# Patient Record
Sex: Female | Born: 1980 | Race: White | Hispanic: No | Marital: Married | Smoking: Never smoker
Health system: Southern US, Community
[De-identification: ages and names within clinical notes are randomized; demographics above are authoritative.]

## PROBLEM LIST (undated history)

## (undated) DIAGNOSIS — D649 Anemia, unspecified: Secondary | ICD-10-CM

## (undated) HISTORY — PX: OTHER SURGICAL HISTORY: SHX169

## (undated) HISTORY — DX: Anemia, unspecified: D64.9

---

## 2013-11-25 DIAGNOSIS — R55 Syncope and collapse: Secondary | ICD-10-CM | POA: Insufficient documentation

## 2013-11-25 HISTORY — DX: Syncope and collapse: R55

## 2015-06-17 ENCOUNTER — Ambulatory Visit: Payer: Self-pay | Admitting: Family Medicine

## 2015-07-28 ENCOUNTER — Ambulatory Visit (INDEPENDENT_AMBULATORY_CARE_PROVIDER_SITE_OTHER): Payer: BLUE CROSS/BLUE SHIELD | Admitting: Family Medicine

## 2015-07-28 ENCOUNTER — Encounter: Payer: Self-pay | Admitting: Family Medicine

## 2015-07-28 VITALS — BP 123/81 | HR 70 | Temp 98.7°F | Ht 62.5 in | Wt 126.6 lb

## 2015-07-28 DIAGNOSIS — Z7189 Other specified counseling: Secondary | ICD-10-CM

## 2015-07-28 DIAGNOSIS — Z3169 Encounter for other general counseling and advice on procreation: Secondary | ICD-10-CM

## 2015-07-28 DIAGNOSIS — D225 Melanocytic nevi of trunk: Secondary | ICD-10-CM

## 2015-07-28 DIAGNOSIS — D229 Melanocytic nevi, unspecified: Secondary | ICD-10-CM

## 2015-07-28 DIAGNOSIS — Z7689 Persons encountering health services in other specified circumstances: Secondary | ICD-10-CM

## 2015-07-28 NOTE — Patient Instructions (Signed)
It was very nice to meet you today!  I will arrange for you to see a dermatologist to look at the mole on your behind.  Come and see me for a physical in about 6 months

## 2015-07-28 NOTE — Progress Notes (Signed)
Pre visit review using our clinic tool,if applicable. No additional management support is needed unless otherwise documented below in the visit note.  

## 2015-07-28 NOTE — Progress Notes (Signed)
Christina Lopez at [facility] [address], [place], [place] [address] [phone]  Date:  07/28/2015   Name:  Christina Lopez   DOB:  [birth year]   MRN:  [id number]  PCP:  Lamar Blinks, MD    Chief Complaint: Establish Care   History of Present Illness:  Christina Lopez is a 35 y.o. very pleasant female patient who presents with the following:  She is here today to establish care. 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  She is from [place] and has been here for 2 years.   They are considering having children but "we are not really sure yet.  I may be too old."  However admits that she might want to have kids, has not decided.    She enjoys travel,reading. 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  Never had any surgery. She is generally quite healthy She fainted a year ago- she had a full evaluation that looked ok.  No further fainting. She thinks that her chl may have been a little bit high- she worked on her diet  Never a smoker NKDA For exercise she enjoys using the eliptical machine  There are no active problems to display for this patient.   Past Medical History  Diagnosis Date  . Anemia     No past surgical history on file.  Social History  Substance Use Topics  . Smoking status: Never Smoker   . Smokeless tobacco: Never Used  . Alcohol Use: No    No family history on file.  No Known Allergies  Medication list has been reviewed and updated.  No current outpatient prescriptions on file prior to visit.   No current facility-administered medications on file prior to visit.    Review of Systems:  As per HPI- otherwise negative.   Physical Examination: Filed Vitals:   07/28/15 1516  BP: 123/81  Pulse: 70  Temp: 98.7 F (37.1 C)   Filed Vitals:   07/28/15 1516  Height: 5' 2.5" (1.588 m)  Weight: 126 lb 9.6 oz (57.425 kg)   Body mass index is  22.77 kg/(m^2). Ideal Body Weight: Weight in (lb) to have BMI = 25: 138.6  GEN: WDWN, NAD, Non-toxic, A & O x 3, looks well HEENT: Atraumatic, Normocephalic. Neck supple. No masses, No LAD.  Bilateral TM wnl, oropharynx normal.  PEERL,EOMI.   Ears and Nose: No external deformity. CV: RRR, No M/G/R. No JVD. No thrill. No extra heart sounds. PULM: CTA B, no wheezes, crackles, rhonchi. No retractions. No resp. distress. No accessory muscle use. ABD: S, NT, ND, +BS. No rebound. No HSM. EXTR: No c/c/e NEURO Normal gait.  PSYCH: Normally interactive. Conversant. Not depressed or anxious appearing.  Calm demeanor.  Mole on left upper buttock- dark in color, irregular border, scaly.  Pt points this out as a concern but she also has many other moles on her skin  Assessment and Plan: Nevus of buttock - Plan: Ambulatory referral to Dermatology  Multiple nevi  Pre-conception counseling  Establishing care with new doctor, encounter for  Derm referral to eval unusual appearing mole Advised that she is likely to be able to conceive at this point- however if this is something they want I would encourage them to start trying soon and to seek fertility help if no success in 6 months.  Encouraged them to frankly discuss their desires and to make  a decision about children as a couple  Plan for a CPE in 6 months  Signed Lamar Blinks, MD

## 2017-03-03 DIAGNOSIS — Z23 Encounter for immunization: Secondary | ICD-10-CM | POA: Diagnosis not present

## 2017-09-01 ENCOUNTER — Encounter (HOSPITAL_BASED_OUTPATIENT_CLINIC_OR_DEPARTMENT_OTHER): Payer: Self-pay | Admitting: Emergency Medicine

## 2017-09-01 ENCOUNTER — Other Ambulatory Visit: Payer: Self-pay

## 2017-09-01 ENCOUNTER — Emergency Department (HOSPITAL_BASED_OUTPATIENT_CLINIC_OR_DEPARTMENT_OTHER)
Admission: EM | Admit: 2017-09-01 | Discharge: 2017-09-02 | Disposition: A | Discharge status: Home / Self Care | Payer: BLUE CROSS/BLUE SHIELD | Attending: Emergency Medicine | Admitting: Emergency Medicine

## 2017-09-01 DIAGNOSIS — O218 Other vomiting complicating pregnancy: Secondary | ICD-10-CM | POA: Diagnosis not present

## 2017-09-01 DIAGNOSIS — Z3A01 Less than 8 weeks gestation of pregnancy: Secondary | ICD-10-CM | POA: Diagnosis not present

## 2017-09-01 DIAGNOSIS — Z3491 Encounter for supervision of normal pregnancy, unspecified, first trimester: Secondary | ICD-10-CM | POA: Insufficient documentation

## 2017-09-01 DIAGNOSIS — R112 Nausea with vomiting, unspecified: Secondary | ICD-10-CM

## 2017-09-01 DIAGNOSIS — Z3A00 Weeks of gestation of pregnancy not specified: Secondary | ICD-10-CM | POA: Diagnosis not present

## 2017-09-01 DIAGNOSIS — R8271 Bacteriuria: Secondary | ICD-10-CM | POA: Insufficient documentation

## 2017-09-01 DIAGNOSIS — O9989 Other specified diseases and conditions complicating pregnancy, childbirth and the puerperium: Secondary | ICD-10-CM | POA: Diagnosis not present

## 2017-09-01 DIAGNOSIS — O210 Mild hyperemesis gravidarum: Secondary | ICD-10-CM | POA: Diagnosis not present

## 2017-09-01 LAB — URINALYSIS, ROUTINE W REFLEX MICROSCOPIC
BILIRUBIN URINE: NEGATIVE
Glucose, UA: NEGATIVE mg/dL
HGB URINE DIPSTICK: NEGATIVE
Ketones, ur: 40 mg/dL — AB
Nitrite: NEGATIVE
PROTEIN: NEGATIVE mg/dL
SPECIFIC GRAVITY, URINE: 1.015 (ref 1.005–1.030)
pH: 7.5 (ref 5.0–8.0)

## 2017-09-01 LAB — URINALYSIS, MICROSCOPIC (REFLEX)

## 2017-09-01 LAB — PREGNANCY, URINE: Preg Test, Ur: POSITIVE — AB

## 2017-09-01 NOTE — ED Notes (Signed)
Family at bedside. 

## 2017-09-01 NOTE — ED Provider Notes (Signed)
[facility] EMERGENCY DEPARTMENT Provider Note   CSN: [id number] Arrival date & time: 09/01/17  2017     History   Chief Complaint Chief Complaint  Patient presents with  . Emesis    HPI Christina Lopez is a 37 y.o. female.  HPI  Patient is a 37 year old female, G1, P0 with a history of anemia presenting for emesis.  Patient reports that she took a pregnancy test last week and it was positive.  Patient reports she is been experiencing morning nausea without emesis for the past week.  Patient reports that today, she vomited approximately 6 times last around 2 PM, and then subsequently developed a left-sided headache.  Patient reports it is consistent with prior headaches in her life, but she typically only gets them before menses.  Patient denies any abdominal pain, vaginal bleeding, fevers, chills.  Last menstrual period was April 21 of 2019.  Past Medical History:  Diagnosis Date  . Anemia     There are no active problems to display for this patient.   History reviewed. No pertinent surgical history.   OB History    Gravida  1   Para      Term      Preterm      AB      Living        SAB      TAB      Ectopic      Multiple      Live Births               Home Medications    Prior to Admission medications   Not on File    Family History History reviewed. No pertinent family history.  Social History Social History   Tobacco Use  . Smoking status: Never Smoker  . Smokeless tobacco: Never Used  Substance Use Topics  . Alcohol use: No    Alcohol/week: 0.0 oz  . Drug use: Not on file     Allergies   Patient has no known allergies.   Review of Systems Review of Systems  Constitutional: Negative for chills.  Gastrointestinal: Positive for nausea and vomiting. Negative for abdominal pain.  Genitourinary: Negative for difficulty urinating, flank pain, frequency, urgency, vaginal bleeding, vaginal discharge and vaginal  pain.       +amenorrhea  All other systems reviewed and are negative.    Physical Exam Updated Vital Signs BP (!) 118/91 (BP Location: Left Arm)   Pulse 70   Temp 97.7 F (36.5 C) (Oral)   Resp 18   Wt 55 kg (121 lb 4.1 oz)   SpO2 100%   BMI 21.82 kg/m   Physical Exam  Constitutional: She appears well-developed and well-nourished. No distress.  HENT:  Head: Normocephalic and atraumatic.  Mouth/Throat: Oropharynx is clear and moist.  Eyes: Pupils are equal, round, and reactive to light. Conjunctivae and EOM are normal.  Neck: Normal range of motion. Neck supple.  Cardiovascular: Normal rate, regular rhythm, S1 normal and S2 normal.  No murmur heard. Pulmonary/Chest: Effort normal and breath sounds normal. She has no wheezes. She has no rales.  Abdominal: Soft. She exhibits no distension. There is no tenderness. There is no guarding.  Musculoskeletal: Normal range of motion. She exhibits no edema or deformity.  Neurological: She is alert.  Cranial nerves grossly intact. Patient moves extremities symmetrically and with good coordination.  Skin: Skin is warm and dry. No erythema.  Dry skin lesion noted to right antecubital  fossa, c/w prior psoriasis per pt.  Psychiatric: She has a normal mood and affect. Her behavior is normal. Judgment and thought content normal.  Nursing note and vitals reviewed.    ED Treatments / Results  Labs (all labs ordered are listed, but only abnormal results are displayed) Labs Reviewed  URINALYSIS, ROUTINE W REFLEX MICROSCOPIC - Abnormal; Notable for the following components:      Result Value   APPearance CLOUDY (*)    Ketones, ur 40 (*)    Leukocytes, UA SMALL (*)    All other components within normal limits  URINALYSIS, MICROSCOPIC (REFLEX) - Abnormal; Notable for the following components:   Bacteria, UA MANY (*)    All other components within normal limits  PREGNANCY, URINE - Abnormal; Notable for the following components:   Preg  Test, Ur POSITIVE (*)    All other components within normal limits    EKG None  Radiology No results found.  Procedures Procedures (including critical care time)  Medications Ordered in ED Medications - No data to display   Initial Impression / Assessment and Plan / ED Course  I have reviewed the triage vital signs and the nursing notes.  Pertinent labs & imaging results that were available during my care of the patient were reviewed by me and considered in my medical decision making (see chart for details).     Patient is well-appearing and in no acute distress.  Patient has a completely benign abdomen.  Patient denying any nausea, vomiting, or abdominal pain at this time.  Patient reports her headache has spontaneously resolved from earlier today.    Confirmed pregnancy with urine pregnancy today.  Patient also has asymptomatic bacteriuria, will treat with Keflex.  No signs of pyelonephritis.  Patient successfully perform p.o. challenge in emergency department.  Discussed with patient safe analgesia in pregnancy with Tylenol.  Prescribed doxylamine and pyridoxine for nausea management.  Patient is following up with her first OB/GYN appointment on Tuesday, 6- 4-20 19.  Patient can return precautions for any intractable nausea or vomiting, vaginal bleeding, or severe abdominal pain.  Patient is in understanding and agrees with plan of care.  This is a supervised visit with Dr. Malvin Johns. Evaluation, management, and discharge planning discussed with this attending physician.  Final Clinical Impressions(s) / ED Diagnoses   Final diagnoses:  First trimester pregnancy  Bacteria in urine  Non-intractable vomiting with nausea, unspecified vomiting type      Tamala Julian 09/02/17 1157    Malvin Johns, MD 09/02/17 1501

## 2017-09-01 NOTE — ED Triage Notes (Signed)
Patient states that she is pregnant  - today she started to have vomiting that she was unsual for her, and a headache. She is concerned because she is pregnant what to take for the headache

## 2017-09-02 MED ORDER — CEPHALEXIN 500 MG PO CAPS: 500.0000 mg | ORAL_CAPSULE | Freq: Two times a day (BID) | ORAL | 0 refills | Status: AC

## 2017-09-02 MED ORDER — DOXYLAMINE SUCCINATE (SLEEP) 25 MG PO TABS: 12.5000 mg | ORAL_TABLET | Freq: Three times a day (TID) | ORAL | 0 refills | Status: DC

## 2017-09-02 MED ORDER — PYRIDOXINE HCL 25 MG PO TABS: 12.5000 mg | ORAL_TABLET | Freq: Three times a day (TID) | ORAL | 0 refills | Status: DC

## 2017-09-02 MED ORDER — CEPHALEXIN 500 MG PO CAPS: 500.0000 mg | ORAL_CAPSULE | Freq: Two times a day (BID) | ORAL | 0 refills | Status: DC

## 2017-09-02 MED ORDER — CEPHALEXIN 250 MG PO CAPS
500.0000 mg | ORAL_CAPSULE | Freq: Once | ORAL | Status: AC
  Administered 2017-09-02: 500 mg via ORAL
  Filled 2017-09-02: qty 2

## 2017-09-02 NOTE — Discharge Instructions (Signed)
Please see the information and instructions below regarding your visit.  Your diagnoses today include:  1. First trimester pregnancy   2. Bacteria in urine   3. Non-intractable vomiting with nausea, unspecified vomiting type     Tests performed today include: See side panel of your discharge paperwork for testing performed today. Vital signs are listed at the bottom of these instructions.   Medications prescribed:    Take any prescribed medications only as prescribed, and any over the counter medications only as directed on the packaging.  You may use doxylamine, available as an over-the-counter sleeping pills (eg, Unisom Sleep Tabs): One-half of the 25 mg over-the-counter tablet twice a day for vomiting. In addition, pyridoxine 25 mg (Vitamin B6), also available over-the-counter, is taken three or four times per day for vomiting.   Doxylamine is similar to Benadryl and can make you sleepy. Please do not drive while taking this medication.  Your urine shows you have some bacteria in it.  In pregnant women, we treat this is a urinary tract infection.  He will take the antibiotic Keflex.  For pain, you may take Tylenol, 650 mg every 6 hours as needed for pain.  Home care instructions:  Please follow any educational materials contained in this packet.   Please start with bland foods for your nausea.  Ginger ale is good for nausea of early pregnancy.  Follow-up instructions: Please follow-up with your OB/GYN for your previously scheduled appointment.  Return instructions:  Please return to the Emergency Department if you experience worsening symptoms.  Please return to the emergency department for any increasing belly pain, nausea or vomiting the prevents you from keeping anything down, or vaginal bleeding. Please return if you have any other emergent concerns.  Additional Information:   Your vital signs today were: BP (!) 118/91 (BP Location: Left Arm)    Pulse 70    Temp 97.7 F  (36.5 C) (Oral)    Resp 18    Wt 55 kg (121 lb 4.1 oz)    SpO2 100%    BMI 21.82 kg/m  If your blood pressure (BP) was elevated on multiple readings during this visit above 130 for the top number or above 80 for the bottom number, please have this repeated by your primary care provider within one month. --------------  Thank you for allowing [place] to participate in your care today.

## 2017-09-02 NOTE — ED Notes (Signed)
Patient is alert and orientedx4.  Patient was explained discharge instructions and they understood them with no questions.  The patient's husband Kittie Plater, is taking the patient home.

## 2017-09-04 DIAGNOSIS — Z3201 Encounter for pregnancy test, result positive: Secondary | ICD-10-CM | POA: Diagnosis not present

## 2017-09-04 DIAGNOSIS — Z3400 Encounter for supervision of normal first pregnancy, unspecified trimester: Secondary | ICD-10-CM | POA: Diagnosis not present

## 2017-09-04 DIAGNOSIS — Z3401 Encounter for supervision of normal first pregnancy, first trimester: Secondary | ICD-10-CM | POA: Diagnosis not present

## 2017-09-04 LAB — OB RESULTS CONSOLE GC/CHLAMYDIA
Chlamydia: NEGATIVE
Gonorrhea: NEGATIVE

## 2017-09-04 LAB — OB RESULTS CONSOLE RPR: RPR: NONREACTIVE

## 2017-09-04 LAB — OB RESULTS CONSOLE ABO/RH: RH TYPE: NEGATIVE

## 2017-09-04 LAB — OB RESULTS CONSOLE ANTIBODY SCREEN: ANTIBODY SCREEN: NEGATIVE

## 2017-09-04 LAB — OB RESULTS CONSOLE RUBELLA ANTIBODY, IGM: Rubella: IMMUNE

## 2017-09-04 LAB — OB RESULTS CONSOLE HIV ANTIBODY (ROUTINE TESTING): HIV: NONREACTIVE

## 2017-09-04 LAB — OB RESULTS CONSOLE HEPATITIS B SURFACE ANTIGEN: HEP B S AG: NEGATIVE

## 2017-09-09 NOTE — Progress Notes (Signed)
[place] at [facility] [address], [place], [place] [address] [phone]  Date:  09/10/2017   Name:  Christina Lopez   DOB:  [birth year]   MRN:  [id number]  PCP:  Darreld Mclean, MD    Chief Complaint: Routine follow up (recent pregnancy, dental implants)   History of Present Illness:  Christina Lopez is a 37 y.o. very pleasant female patient who presents with the following:  Last seen by myself about 2 years ago.   She was seen in the ER just recently and dx with pregnancy - she is due at the end of January 2020. She has a GYN doctor with [facility] and has been to her first visit already   She has some nausea from her pregnancy but she is otherwise doing ok She does have some vomiting but not terrible- last threw up a week ago  She had a dental implant placed last year and is doing well in this regard  She really does not have any other concerns today but wanted to be seen as it has been a while She has a copy of her green card health paperwork which we will scan in for her No need to do labs today as her GYN is monitoring her glucose, etc She denies any abd cramping or vaginal bleeding  [name]  There are no active problems to display for this patient.   Past Medical History:  Diagnosis Date  . Anemia     History reviewed. No pertinent surgical history.  Social History   Tobacco Use  . Smoking status: Never Smoker  . Smokeless tobacco: Never Used  Substance Use Topics  . Alcohol use: No    Alcohol/week: 0.0 oz  . Drug use: Not on file    History reviewed. No pertinent family history.  No Known Allergies  Medication list has been reviewed and updated.  Current Outpatient Medications on File Prior to Visit  Medication Sig Dispense Refill  . doxylamine, Sleep, (UNISOM) 25 MG tablet Take 0.5 tablets (12.5 mg total) by mouth 3 (three)  times daily. 15 tablet 0  . pyridOXINE (VITAMIN B-6) 25 MG tablet Take 0.5 tablets (12.5 mg total) by mouth 3 (three) times daily. 15 tablet 0   No current facility-administered medications on file prior to visit.     Review of Systems:  As per HPI- otherwise negative. No fever or chills No CP or SOB She is generally in good health    Physical Examination: Vitals:   09/10/17 1523  BP: 106/70  Pulse: 78  Resp: 16  SpO2: 100%   Vitals:   09/10/17 1523  Weight: 137 lb (62.1 kg)  Height: 5' 2.5" (1.588 m)   Body mass index is 24.66 kg/m. Ideal Body Weight: Weight in (lb) to have BMI = 25: 138.6  GEN: WDWN, NAD, Non-toxic, A & O x 3, normal weight, looks well  HEENT: Atraumatic, Normocephalic. Neck supple. No masses, No LAD.  Bilateral TM wnl, oropharynx normal.  PEERL,EOMI.   Ears and Nose: No external deformity. CV: RRR, No M/G/R. No JVD. No thrill. No extra heart sounds. PULM: CTA B, no wheezes, crackles, rhonchi. No retractions. No resp. distress. No accessory muscle use. ABD: S, NT, ND, +BS. No rebound. No HSM. EXTR: No c/c/e NEURO Normal gait.  PSYCH: Normally interactive. Conversant. Not depressed or anxious appearing.  Calm  demeanor.    Assessment and Plan: Immunizations reviewed and up to date  Pregnancy, first, first trimester  Nausea and vomiting during pregnancy  Following up with me today She is pregnancy with their first child and is doing well so far- shared my congratulations and hopes that all will go smoothly for her.  Counseled her to follow-up with her GYN for any severe nausea/ vomiting as there are rx medicatoins that might be helpful She did have a Tdap last year   Signed Lamar Blinks, MD

## 2017-09-10 ENCOUNTER — Ambulatory Visit: Payer: BLUE CROSS/BLUE SHIELD | Admitting: Family Medicine

## 2017-09-10 ENCOUNTER — Encounter: Payer: Self-pay | Admitting: Family Medicine

## 2017-09-10 VITALS — BP 106/70 | HR 78 | Resp 16 | Ht 62.5 in | Wt 137.0 lb

## 2017-09-10 DIAGNOSIS — Z7689 Persons encountering health services in other specified circumstances: Secondary | ICD-10-CM

## 2017-09-10 DIAGNOSIS — Z3401 Encounter for supervision of normal first pregnancy, first trimester: Secondary | ICD-10-CM

## 2017-09-10 DIAGNOSIS — O219 Vomiting of pregnancy, unspecified: Secondary | ICD-10-CM | POA: Diagnosis not present

## 2017-09-10 NOTE — Patient Instructions (Signed)
Great to see you today! No labs today as your GYN is doing plenty of bloodwork. Best of luck with the rest of your pregnancy and take care!

## 2017-10-03 ENCOUNTER — Other Ambulatory Visit (HOSPITAL_COMMUNITY)
Admission: RE | Admit: 2017-10-03 | Discharge: 2017-10-03 | Disposition: A | Discharge status: Home / Self Care | Payer: BLUE CROSS/BLUE SHIELD | Source: Ambulatory Visit | Attending: Obstetrics and Gynecology | Admitting: Obstetrics and Gynecology

## 2017-10-03 ENCOUNTER — Other Ambulatory Visit: Payer: Self-pay | Admitting: Obstetrics and Gynecology

## 2017-10-03 DIAGNOSIS — O26899 Other specified pregnancy related conditions, unspecified trimester: Secondary | ICD-10-CM | POA: Diagnosis not present

## 2017-10-03 DIAGNOSIS — Z01411 Encounter for gynecological examination (general) (routine) with abnormal findings: Secondary | ICD-10-CM | POA: Insufficient documentation

## 2017-10-03 DIAGNOSIS — N898 Other specified noninflammatory disorders of vagina: Secondary | ICD-10-CM | POA: Diagnosis not present

## 2017-10-03 DIAGNOSIS — O09511 Supervision of elderly primigravida, first trimester: Secondary | ICD-10-CM | POA: Diagnosis not present

## 2017-10-09 LAB — CYTOLOGY - PAP
Chlamydia: NEGATIVE
DIAGNOSIS: NEGATIVE
HPV (WINDOPATH): NOT DETECTED
Neisseria Gonorrhea: NEGATIVE

## 2017-10-10 DIAGNOSIS — O09511 Supervision of elderly primigravida, first trimester: Secondary | ICD-10-CM | POA: Diagnosis not present

## 2017-11-27 DIAGNOSIS — O09512 Supervision of elderly primigravida, second trimester: Secondary | ICD-10-CM | POA: Diagnosis not present

## 2017-11-27 DIAGNOSIS — Z36 Encounter for antenatal screening for chromosomal anomalies: Secondary | ICD-10-CM | POA: Diagnosis not present

## 2017-11-27 DIAGNOSIS — Z3402 Encounter for supervision of normal first pregnancy, second trimester: Secondary | ICD-10-CM | POA: Diagnosis not present

## 2017-12-25 DIAGNOSIS — Z23 Encounter for immunization: Secondary | ICD-10-CM | POA: Diagnosis not present

## 2018-01-22 DIAGNOSIS — O09512 Supervision of elderly primigravida, second trimester: Secondary | ICD-10-CM | POA: Diagnosis not present

## 2018-01-31 DIAGNOSIS — Z3182 Encounter for Rh incompatibility status: Secondary | ICD-10-CM | POA: Diagnosis not present

## 2018-02-12 DIAGNOSIS — Z23 Encounter for immunization: Secondary | ICD-10-CM | POA: Diagnosis not present

## 2018-04-02 DIAGNOSIS — Z3483 Encounter for supervision of other normal pregnancy, third trimester: Secondary | ICD-10-CM | POA: Diagnosis not present

## 2018-04-02 LAB — OB RESULTS CONSOLE GBS: GBS: NEGATIVE

## 2018-04-03 NOTE — L&D Delivery Note (Signed)
Delivery Note At 5:11 PM a viable female was delivered via Vaginal, Spontaneous (Presentation:occiput anterior  ;  ).  APGAR: 9, 9; weight ( pending) .   Placenta status:intact 3 vessel cord  with the following complications: None  Cord collected for cord blood registry   Cord pH: NA  Anesthesia:  Epidural  Episiotomy: None Lacerations: 2nd degree Suture Repair: 3.0 vicryl Est. Blood Loss (mL):  250 mL  Mom to postpartum.  Baby to Couplet care / Skin to Skin.  Christophe Louis 04/24/2018, 5:49 PM

## 2018-04-17 ENCOUNTER — Telehealth (HOSPITAL_COMMUNITY): Payer: Self-pay | Admitting: *Deleted

## 2018-04-17 ENCOUNTER — Encounter (HOSPITAL_COMMUNITY): Payer: Self-pay | Admitting: *Deleted

## 2018-04-17 ENCOUNTER — Other Ambulatory Visit (HOSPITAL_COMMUNITY): Payer: Self-pay | Admitting: Obstetrics and Gynecology

## 2018-04-17 DIAGNOSIS — Z3A39 39 weeks gestation of pregnancy: Secondary | ICD-10-CM

## 2018-04-17 DIAGNOSIS — O09513 Supervision of elderly primigravida, third trimester: Secondary | ICD-10-CM

## 2018-04-17 NOTE — Telephone Encounter (Signed)
Preadmission screen  

## 2018-04-22 ENCOUNTER — Other Ambulatory Visit: Payer: Self-pay | Admitting: Obstetrics and Gynecology

## 2018-04-22 ENCOUNTER — Ambulatory Visit (HOSPITAL_COMMUNITY)
Admission: RE | Admit: 2018-04-22 | Discharge: 2018-04-22 | Disposition: A | Discharge status: Home / Self Care | Payer: BLUE CROSS/BLUE SHIELD | Source: Ambulatory Visit | Attending: Obstetrics and Gynecology | Admitting: Obstetrics and Gynecology

## 2018-04-22 DIAGNOSIS — O09513 Supervision of elderly primigravida, third trimester: Secondary | ICD-10-CM | POA: Diagnosis not present

## 2018-04-22 DIAGNOSIS — Z3A39 39 weeks gestation of pregnancy: Secondary | ICD-10-CM | POA: Insufficient documentation

## 2018-04-22 NOTE — H&P (Deleted)
  The note originally documented on this encounter has been moved the the encounter in which it belongs.  

## 2018-04-22 NOTE — H&P (Signed)
Christina Lopez is a 38 y.o. female  G1P0 at 40 wks and 0 days presenting for induction of labor due to advanced maternal age at term. Pregnancy has been otherwise uncomplicated. Prenatal care provided by Dr. Christophe Louis . OB History    Gravida  1   Para      Term      Preterm      AB      Living        SAB      TAB      Ectopic      Multiple      Live Births             Past Medical History:  Diagnosis Date  . Anemia    No past surgical history on file. Family History: family history includes Breast cancer in her maternal grandmother; Depression in her maternal grandmother; Diabetes in her paternal grandfather; Heart disease in her paternal grandmother; Hypertension in her paternal grandmother. Social History:  reports that she has never smoked. She has never used smokeless tobacco. She reports that she does not drink alcohol. No history on file for drug.     Maternal Diabetes: No Genetic Screening: Normal Maternal Ultrasounds/Referrals: Normal Fetal Ultrasounds or other Referrals:  None Maternal Substance Abuse:  No Significant Maternal Medications:  None Significant Maternal Lab Results:  Lab values include: Group B Strep negative Other Comments:  None  Review of Systems  Constitutional: Negative.   HENT: Negative.   Eyes: Negative.   Respiratory: Negative.   Cardiovascular: Negative.   Gastrointestinal: Negative.   Genitourinary: Negative.   Musculoskeletal: Negative.   Skin: Negative.   Neurological: Negative.   Endo/Heme/Allergies: Negative.   Psychiatric/Behavioral: Negative.    History   Last menstrual period 07/18/2017. Exam Physical Exam  Vitals reviewed. Constitutional: She is oriented to person, place, and time. She appears well-developed and well-nourished.  HENT:  Head: Normocephalic and atraumatic.  Eyes: Pupils are equal, round, and reactive to light. Conjunctivae are normal.  Neck: Normal range of motion. Neck supple.   Cardiovascular: Normal rate and regular rhythm.  Respiratory: Effort normal and breath sounds normal.  GI: Bowel sounds are normal. There is no abdominal tenderness.  Genitourinary:    Vagina normal.   Musculoskeletal: Normal range of motion.        General: No edema.  Neurological: She is alert and oriented to person, place, and time.  Skin: Skin is warm and dry.  Psychiatric: She has a normal mood and affect.    Prenatal labs: ABO, Rh: A/Negative/-- (06/04 0000) Antibody: Negative (06/04 0000) Rubella: Immune (06/04 0000) RPR: Nonreactive (06/04 0000)  HBsAg: Negative (06/04 0000)  HIV: Non-reactive (06/04 0000)  GBS: Negative (12/31 0000)   Assessment/Plan: 40 wks and 0 days with AMA for induction at term.  Cytotec for cervical ripening  Anticipate SVD.   Christophe Louis 04/22/2018, 5:15 PM

## 2018-04-24 ENCOUNTER — Inpatient Hospital Stay (HOSPITAL_COMMUNITY): Payer: BLUE CROSS/BLUE SHIELD | Admitting: Anesthesiology

## 2018-04-24 ENCOUNTER — Encounter (HOSPITAL_COMMUNITY): Payer: Self-pay

## 2018-04-24 ENCOUNTER — Inpatient Hospital Stay (HOSPITAL_COMMUNITY)
Admission: RE | Admit: 2018-04-24 | Discharge: 2018-04-26 | DRG: 807 | Disposition: A | Discharge status: Home / Self Care | Payer: BLUE CROSS/BLUE SHIELD | Attending: Obstetrics and Gynecology | Admitting: Obstetrics and Gynecology

## 2018-04-24 ENCOUNTER — Inpatient Hospital Stay (HOSPITAL_COMMUNITY): Admission: RE | Admit: 2018-04-24 | Payer: BLUE CROSS/BLUE SHIELD | Source: Ambulatory Visit

## 2018-04-24 ENCOUNTER — Inpatient Hospital Stay (HOSPITAL_COMMUNITY)
Admission: AD | Admit: 2018-04-24 | Payer: BLUE CROSS/BLUE SHIELD | Source: Ambulatory Visit | Admitting: Obstetrics and Gynecology

## 2018-04-24 DIAGNOSIS — O26893 Other specified pregnancy related conditions, third trimester: Secondary | ICD-10-CM | POA: Diagnosis not present

## 2018-04-24 DIAGNOSIS — Z9189 Other specified personal risk factors, not elsewhere classified: Secondary | ICD-10-CM | POA: Diagnosis not present

## 2018-04-24 DIAGNOSIS — Z3A40 40 weeks gestation of pregnancy: Secondary | ICD-10-CM | POA: Diagnosis not present

## 2018-04-24 DIAGNOSIS — Z3A39 39 weeks gestation of pregnancy: Secondary | ICD-10-CM | POA: Diagnosis not present

## 2018-04-24 DIAGNOSIS — O09513 Supervision of elderly primigravida, third trimester: Secondary | ICD-10-CM

## 2018-04-24 DIAGNOSIS — Z23 Encounter for immunization: Secondary | ICD-10-CM | POA: Diagnosis not present

## 2018-04-24 HISTORY — DX: Supervision of elderly primigravida, third trimester: O09.513

## 2018-04-24 LAB — CBC
HCT: 35.4 % — ABNORMAL LOW (ref 36.0–46.0)
Hemoglobin: 11.9 g/dL — ABNORMAL LOW (ref 12.0–15.0)
MCH: 32.2 pg (ref 26.0–34.0)
MCHC: 33.6 g/dL (ref 30.0–36.0)
MCV: 95.7 fL (ref 80.0–100.0)
Platelets: 171 10*3/uL (ref 150–400)
RBC: 3.7 MIL/uL — ABNORMAL LOW (ref 3.87–5.11)
RDW: 14.3 % (ref 11.5–15.5)
WBC: 8.1 10*3/uL (ref 4.0–10.5)

## 2018-04-24 LAB — RPR: RPR Ser Ql: NONREACTIVE

## 2018-04-24 MED ORDER — PHENYLEPHRINE 40 MCG/ML (10ML) SYRINGE FOR IV PUSH (FOR BLOOD PRESSURE SUPPORT)
80.0000 ug | PREFILLED_SYRINGE | INTRAVENOUS | Status: DC | PRN
  Filled 2018-04-24: qty 10

## 2018-04-24 MED ORDER — OXYCODONE-ACETAMINOPHEN 5-325 MG PO TABS: 1.0000 | ORAL_TABLET | ORAL | Status: DC | PRN

## 2018-04-24 MED ORDER — LACTATED RINGERS IV SOLN: 500.0000 mL | INTRAVENOUS | Status: DC | PRN

## 2018-04-24 MED ORDER — SENNOSIDES-DOCUSATE SODIUM 8.6-50 MG PO TABS
2.0000 | ORAL_TABLET | ORAL | Status: DC
  Administered 2018-04-25 – 2018-04-26 (×2): 2 via ORAL
  Filled 2018-04-24 (×2): qty 2

## 2018-04-24 MED ORDER — METHYLERGONOVINE MALEATE 0.2 MG PO TABS: 0.2000 mg | ORAL_TABLET | ORAL | Status: DC | PRN

## 2018-04-24 MED ORDER — ACETAMINOPHEN 325 MG PO TABS: 650.0000 mg | ORAL_TABLET | ORAL | Status: DC | PRN

## 2018-04-24 MED ORDER — ONDANSETRON HCL 4 MG/2ML IJ SOLN: 4.0000 mg | INTRAMUSCULAR | Status: DC | PRN

## 2018-04-24 MED ORDER — OXYTOCIN BOLUS FROM INFUSION: 500.0000 mL | Freq: Once | INTRAVENOUS | Status: DC

## 2018-04-24 MED ORDER — DIPHENHYDRAMINE HCL 25 MG PO CAPS: 25.0000 mg | ORAL_CAPSULE | Freq: Four times a day (QID) | ORAL | Status: DC | PRN

## 2018-04-24 MED ORDER — SIMETHICONE 80 MG PO CHEW: 80.0000 mg | CHEWABLE_TABLET | ORAL | Status: DC | PRN

## 2018-04-24 MED ORDER — ZOLPIDEM TARTRATE 5 MG PO TABS: 5.0000 mg | ORAL_TABLET | Freq: Every evening | ORAL | Status: DC | PRN

## 2018-04-24 MED ORDER — FENTANYL 2.5 MCG/ML BUPIVACAINE 1/10 % EPIDURAL INFUSION (WH - ANES)
INTRAMUSCULAR | Status: AC
  Filled 2018-04-24: qty 100

## 2018-04-24 MED ORDER — COCONUT OIL OIL
1.0000 "application " | TOPICAL_OIL | Status: DC | PRN
  Administered 2018-04-25: 1 via TOPICAL
  Filled 2018-04-24: qty 120

## 2018-04-24 MED ORDER — LIDOCAINE HCL (PF) 1 % IJ SOLN
30.0000 mL | INTRAMUSCULAR | Status: DC | PRN
  Filled 2018-04-24: qty 30

## 2018-04-24 MED ORDER — TERBUTALINE SULFATE 1 MG/ML IJ SOLN
0.2500 mg | Freq: Once | INTRAMUSCULAR | Status: DC | PRN
  Filled 2018-04-24: qty 1

## 2018-04-24 MED ORDER — DIBUCAINE 1 % RE OINT: 1.0000 "application " | TOPICAL_OINTMENT | RECTAL | Status: DC | PRN

## 2018-04-24 MED ORDER — EPHEDRINE 5 MG/ML INJ
10.0000 mg | INTRAVENOUS | Status: DC | PRN
  Filled 2018-04-24: qty 2

## 2018-04-24 MED ORDER — OXYTOCIN 40 UNITS IN NORMAL SALINE INFUSION - SIMPLE MED
1.0000 m[IU]/min | INTRAVENOUS | Status: DC
  Administered 2018-04-24: 2 m[IU]/min via INTRAVENOUS
  Filled 2018-04-24: qty 1000

## 2018-04-24 MED ORDER — MISOPROSTOL 25 MCG QUARTER TABLET
25.0000 ug | ORAL_TABLET | ORAL | Status: DC | PRN
  Administered 2018-04-24: 25 ug via VAGINAL
  Filled 2018-04-24 (×3): qty 1

## 2018-04-24 MED ORDER — PHENYLEPHRINE 40 MCG/ML (10ML) SYRINGE FOR IV PUSH (FOR BLOOD PRESSURE SUPPORT)
PREFILLED_SYRINGE | INTRAVENOUS | Status: AC
  Filled 2018-04-24: qty 10

## 2018-04-24 MED ORDER — FENTANYL CITRATE (PF) 100 MCG/2ML IJ SOLN: 50.0000 ug | INTRAMUSCULAR | Status: DC | PRN

## 2018-04-24 MED ORDER — FENTANYL 2.5 MCG/ML BUPIVACAINE 1/10 % EPIDURAL INFUSION (WH - ANES)
14.0000 mL/h | INTRAMUSCULAR | Status: DC | PRN
  Administered 2018-04-24: 14 mL/h via EPIDURAL

## 2018-04-24 MED ORDER — ONDANSETRON HCL 4 MG PO TABS: 4.0000 mg | ORAL_TABLET | ORAL | Status: DC | PRN

## 2018-04-24 MED ORDER — OXYCODONE-ACETAMINOPHEN 5-325 MG PO TABS: 2.0000 | ORAL_TABLET | ORAL | Status: DC | PRN

## 2018-04-24 MED ORDER — PRENATAL MULTIVITAMIN CH
1.0000 | ORAL_TABLET | Freq: Every day | ORAL | Status: DC
  Administered 2018-04-25 – 2018-04-26 (×2): 1 via ORAL
  Filled 2018-04-24 (×2): qty 1

## 2018-04-24 MED ORDER — LACTATED RINGERS IV SOLN: 500.0000 mL | Freq: Once | INTRAVENOUS | Status: DC

## 2018-04-24 MED ORDER — FERROUS SULFATE 325 (65 FE) MG PO TABS
325.0000 mg | ORAL_TABLET | Freq: Two times a day (BID) | ORAL | Status: DC
  Administered 2018-04-25 – 2018-04-26 (×3): 325 mg via ORAL
  Filled 2018-04-24 (×3): qty 1

## 2018-04-24 MED ORDER — METHYLERGONOVINE MALEATE 0.2 MG/ML IJ SOLN: 0.2000 mg | INTRAMUSCULAR | Status: DC | PRN

## 2018-04-24 MED ORDER — IBUPROFEN 600 MG PO TABS
600.0000 mg | ORAL_TABLET | Freq: Four times a day (QID) | ORAL | Status: DC
  Administered 2018-04-24 – 2018-04-26 (×8): 600 mg via ORAL
  Filled 2018-04-24 (×8): qty 1

## 2018-04-24 MED ORDER — SOD CITRATE-CITRIC ACID 500-334 MG/5ML PO SOLN: 30.0000 mL | ORAL | Status: DC | PRN

## 2018-04-24 MED ORDER — OXYTOCIN 40 UNITS IN NORMAL SALINE INFUSION - SIMPLE MED: 2.5000 [IU]/h | INTRAVENOUS | Status: DC

## 2018-04-24 MED ORDER — LACTATED RINGERS IV SOLN
INTRAVENOUS | Status: DC
  Administered 2018-04-24: 01:00:00 via INTRAVENOUS

## 2018-04-24 MED ORDER — BENZOCAINE-MENTHOL 20-0.5 % EX AERO
1.0000 "application " | INHALATION_SPRAY | CUTANEOUS | Status: DC | PRN
  Administered 2018-04-25: 1 via TOPICAL
  Filled 2018-04-24: qty 56

## 2018-04-24 MED ORDER — ONDANSETRON HCL 4 MG/2ML IJ SOLN: 4.0000 mg | Freq: Four times a day (QID) | INTRAMUSCULAR | Status: DC | PRN

## 2018-04-24 MED ORDER — WITCH HAZEL-GLYCERIN EX PADS
1.0000 "application " | MEDICATED_PAD | CUTANEOUS | Status: DC | PRN
  Administered 2018-04-25: 1 via TOPICAL

## 2018-04-24 MED ORDER — DIPHENHYDRAMINE HCL 50 MG/ML IJ SOLN: 12.5000 mg | INTRAMUSCULAR | Status: DC | PRN

## 2018-04-24 MED ORDER — LIDOCAINE HCL (PF) 1 % IJ SOLN
INTRAMUSCULAR | Status: DC | PRN
  Administered 2018-04-24 (×2): 4 mL via EPIDURAL

## 2018-04-24 NOTE — Anesthesia Preprocedure Evaluation (Signed)
Anesthesia Evaluation  Patient identified by MRN, date of birth, ID band Patient awake    Reviewed: Allergy & Precautions, Patient's Chart, lab work & pertinent test results  History of Anesthesia Complications Negative for: history of anesthetic complications  Airway Mallampati: II  TM Distance: >3 FB Neck ROM: Full    Dental  (+) Teeth Intact, Dental Advisory Given   Pulmonary neg pulmonary ROS,    Pulmonary exam normal breath sounds clear to auscultation       Cardiovascular negative cardio ROS Normal cardiovascular exam Rhythm:Regular Rate:Normal     Neuro/Psych negative neurological ROS     GI/Hepatic negative GI ROS, Neg liver ROS,   Endo/Other  negative endocrine ROS  Renal/GU negative Renal ROS     Musculoskeletal negative musculoskeletal ROS (+)   Abdominal   Peds  Hematology negative hematology ROS (+)   Anesthesia Other Findings Day of surgery medications reviewed with the patient.  Reproductive/Obstetrics (+) Pregnancy                             Anesthesia Physical Anesthesia Plan  ASA: II  Anesthesia Plan: Epidural   Post-op Pain Management:    Induction:   PONV Risk Score and Plan: Treatment may vary due to age or medical condition  Airway Management Planned: Natural Airway  Additional Equipment:   Intra-op Plan:   Post-operative Plan:   Informed Consent: I have reviewed the patients History and Physical, chart, labs and discussed the procedure including the risks, benefits and alternatives for the proposed anesthesia with the patient or authorized representative who has indicated his/her understanding and acceptance.       Plan Discussed with: CRNA  Anesthesia Plan Comments:         Anesthesia Quick Evaluation

## 2018-04-24 NOTE — Progress Notes (Signed)
  Subjective:  pt rates contractions as 5 out 10. No lof no vaginal bleeding. +FM   Objective: BP 118/68   Pulse 68   Temp 98.2 F (36.8 C) (Oral)   Resp 16   Ht 5' 2.6" (1.59 m)   Wt 71 kg   LMP 07/18/2017   BMI 28.10 kg/m  No intake/output data recorded. No intake/output data recorded.  FHT:  FHR: 135 bpm, variability: moderate,  accelerations:  Present,  decelerations:  Absent UC:   regular, every 2 minutes SVE:  3/50/-2 AROM clear fluid IUPC placed  Labs: Lab Results  Component Value Date   WBC 8.1 04/24/2018   HGB 11.9 (L) 04/24/2018   HCT 35.4 (L) 04/24/2018   MCV 95.7 04/24/2018   PLT 171 04/24/2018    Assessment / Plan: Induction of labor due to [place] at term.. continue pitocin to reach MvU's of 200   Labor: continue pitocin to reach mvU of 200  Preeclampsia:  NA Fetal Wellbeing:  Category I Pain Control:  Labor support without medications I/D:  n/a Anticipated MOD:  NSVD  Christophe Louis 04/24/2018, 1:25 PM

## 2018-04-24 NOTE — Anesthesia Procedure Notes (Signed)
Epidural Patient location during procedure: OB Start time: 04/24/2018 1:50 PM End time: 04/24/2018 1:53 PM  Staffing Anesthesiologist: Brennan Bailey, MD Performed: anesthesiologist   Preanesthetic Checklist Completed: patient identified, pre-op evaluation, timeout performed, IV checked, risks and benefits discussed and monitors and equipment checked  Epidural Patient position: sitting Prep: site prepped and draped and DuraPrep Patient monitoring: continuous pulse ox, blood pressure, heart rate and cardiac monitor Approach: midline Location: L3-L4 Injection technique: LOR air  Needle:  Needle type: Tuohy  Needle gauge: 17 G Needle length: 9 cm Needle insertion depth: 5 cm Catheter type: closed end flexible Catheter size: 19 Gauge Catheter at skin depth: 10 cm Test dose: negative and Other (1% lidocaine)  Assessment Events: blood not aspirated, injection not painful, no injection resistance, negative IV test and no paresthesia  Additional Notes Patient identified. Risks, benefits, and alternatives discussed with patient including but not limited to bleeding, infection, nerve damage, paralysis, failed block, incomplete pain control, headache, blood pressure changes, nausea, vomiting, reactions to medication, itching, and postpartum back pain. Confirmed with bedside nurse the patient's most recent platelet count. Confirmed with patient that they are not currently taking any anticoagulation, have any bleeding history, or any family history of bleeding disorders. Patient expressed understanding and wished to proceed. All questions were answered. Sterile technique was used throughout the entire procedure. Crisp LOR on first pass. Please see nursing notes for vital signs. Test dose was given through epidural catheter and negative prior to continuing to dose epidural or start infusion. Warning signs of high block given to the patient including shortness of breath, tingling/numbness in  hands, complete motor block, or any concerning symptoms with instructions to call for help. Patient was given instructions on fall risk and not to get out of bed. All questions and concerns addressed with instructions to call with any issues or inadequate analgesia.  Reason for block:procedure for pain

## 2018-04-24 NOTE — Anesthesia Pain Management Evaluation Note (Signed)
  CRNA Pain Management Visit Note  Patient: Christina Lopez, 38 y.o., female  "Hello I am a member of the anesthesia team at [facility]. We have an anesthesia team available at all times to provide care throughout the hospital, including epidural management and anesthesia for C-section. I don't know your plan for the delivery whether it a natural birth, water birth, IV sedation, nitrous supplementation, doula or epidural, but we want to meet your pain goals."   1.Was your pain managed to your expectations on prior hospitalizations?   No prior hospitalizations  2.What is your expectation for pain management during this hospitalization?     Epidural and IV pain meds  3.How can we help you reach that goal? Planning epidural  Record the patient's initial score and the patient's pain goal.   Pain: 6  Pain Goal: 7 [facility] wants you to be able to say your pain was always managed very well.  [facility] 04/24/2018

## 2018-04-24 NOTE — Lactation Note (Signed)
This note was copied from a baby's chart. Lactation Consultation Note  Patient Name: Christina Lopez OIBBC'W Date: 04/24/2018 Reason for consult: Initial assessment;Primapara;1st time breastfeeding;Other (Comment);Term(AMA)  4 hours old FT female who is being exclusively BF by her mother, she's a P1. Mom took BF classes here at [facility] and she's already familiar with hand expression. When revising hand expression with mom, a small drop of colostrum came out of her right nipple. Noticed that mom has semi-flat nipples and she also got some areolar edema. RN has been very proactive and already set her up with shells and a hand pump, instructions, cleaning and storage were reviewed, as well as milk storage guidelines.  Mom has had a difficult time latching baby on, baby was asleep when entering the room but mom agreed to wake her up to feed. LC checked baby's diaper and noticed a stool, dad changed her; documented in [place]. LC took baby STS to mom's right breast in football position but she was unable to latch. LC also did some suck training, baby had a very tight grip with top lip curling on gloved finger; she'll suck on a glove but not at the breast, only a few suckles an then she'll release and cry. An attempt was documented in [place].  Offered to set up a DEBP but mom voiced she was very tired and for now she's just going to focus on the BF tools that have already been given to her. Advised mom to get a comfortable bra to wear her shells, her bra has the metal inserts on the bottom (push up). Discussed normal newborn behavior, baby's sleeping cycle, cluster feeding and lactogenesis II. Mom aware of the possibility that baby may need a nipple shield, will reassess tomorrow to see if areolar edema subsides.  Feeding plan:  1. Encouraged mom to feed baby STS 8-12 times/24 hours or sooner if feeding cues are present 2. Hand expression and pre-pumping was also encouraged along with finger  feeding 3. Mom will continue wearing her breast shells, daytime only and will get a comfortable bra  BF brochure (SP), BF resources and feeding diary (SP) were reviewed. Parents reported all questions and concerns were answered, they're both aware of [place] services and will call PRN.  Maternal Data Formula Feeding for Exclusion: No Has patient been taught Hand Expression?: Yes Does the patient have breastfeeding experience prior to this delivery?: No  Feeding Feeding Type: Breast Fed   Interventions Interventions: Breast feeding basics reviewed;Assisted with latch;Skin to skin;Breast massage;Hand express;Breast compression;Reverse pressure;Shells;Support pillows;Adjust position;Hand pump  Lactation Tools Discussed/Used Tools: Shells;Pump Shell Type: Inverted Breast pump type: Manual WIC Program: No Pump Review: Setup, frequency, and cleaning;Milk Storage Initiated by:: RN Date initiated:: 04/24/18   Consult Status Consult Status: Follow-up Date: 04/25/18 Follow-up type: In-patient    Ronnell Makarewicz Francene Boyers 04/24/2018, 10:03 PM

## 2018-04-25 ENCOUNTER — Encounter (HOSPITAL_COMMUNITY): Payer: Self-pay

## 2018-04-25 LAB — CBC
HCT: 31.3 % — ABNORMAL LOW (ref 36.0–46.0)
Hemoglobin: 10.4 g/dL — ABNORMAL LOW (ref 12.0–15.0)
MCH: 32.2 pg (ref 26.0–34.0)
MCHC: 33.2 g/dL (ref 30.0–36.0)
MCV: 96.9 fL (ref 80.0–100.0)
PLATELETS: 164 10*3/uL (ref 150–400)
RBC: 3.23 MIL/uL — ABNORMAL LOW (ref 3.87–5.11)
RDW: 14.3 % (ref 11.5–15.5)
WBC: 10.2 10*3/uL (ref 4.0–10.5)
nRBC: 0 % (ref 0.0–0.2)

## 2018-04-25 MED ORDER — RHO D IMMUNE GLOBULIN 1500 UNIT/2ML IJ SOSY
300.0000 ug | PREFILLED_SYRINGE | Freq: Once | INTRAMUSCULAR | Status: AC
  Administered 2018-04-25: 300 ug via INTRAVENOUS
  Filled 2018-04-25: qty 2

## 2018-04-25 MED ORDER — IBUPROFEN 600 MG PO TABS: 600.0000 mg | ORAL_TABLET | Freq: Four times a day (QID) | ORAL | 0 refills | Status: DC | PRN

## 2018-04-25 NOTE — Progress Notes (Signed)
Post Partum Day 1 Subjective: no complaints, up ad lib, voiding and tolerating PO  Objective: Blood pressure 129/79, pulse 76, temperature 98.3 F (36.8 C), temperature source Oral, resp. rate 18, height 5' 2.6" (1.59 m), weight 71 kg, last menstrual period 07/18/2017, SpO2 99 %, unknown if currently breastfeeding.  Physical Exam:  General: alert, cooperative and no distress Lochia: appropriate Uterine Fundus: firm Incision: NA DVT Evaluation: No evidence of dvt  Recent Labs    04/24/18 0110 04/25/18 0604  HGB 11.9* 10.4*  HCT 35.4* 31.3*    Assessment/Plan: Plan for discharge tomorrow, Breastfeeding and Lactation consult  Routine postpartum care    LOS: 1 day   Christophe Louis 04/25/2018, 5:51 PM

## 2018-04-25 NOTE — Lactation Note (Signed)
This note was copied from a baby's chart. Lactation Consultation Note  Patient Name: Christina Lopez [id number] Date: 04/25/2018 Reason for consult: Follow-up assessment;Primapara;1st time breastfeeding;Term;Infant weight loss;Nipple pain/trauma;Difficult latch;Other (Comment)(NS changed to #24NS - see LC note )  Baby is 55 hours old  [place] reviewed and updated the doc flow sheets for the consult.  As the [facility] entered the room / pe rmom baby had fed 10 mins with the #20 NS on the left breast  Which is sore. LC noted the #20 NS to be very snug and suspects that is what is causing the  Soreness along with the semi compressible areolas . LC felt the areola was compressible  enough to try without The NS, tried and baby was unable to sustain a latch for only a few sucks and off .   LC resized the mom for #24 NS on the right Breast / football / and baby latched easily with depth, fed for 20 mins /  Multiple swallows and increased with breast compressions. Per mom feeding was comfortable . Nipple well  Rounded and the areola more compressible. [name] plans to set up a DEBP after feeding and mom aware  To pump both breast for 15 -20 mins - save milk for next feeding.  LC instructed mom on the use comfort gels for after pumping alternating with shells except  When sleeping ( mom already has been instructed and the hand pump.  Both mom and dad seemed to be encouraged by the latch and the swallows. Inside of the NS was sticky after the feeding.     Maternal Data Has patient been taught Hand Expression?: Yes  Feeding Feeding Type: Breast Fed  LATCH Score Latch: Grasps breast easily, tongue down, lips flanged, rhythmical sucking.  Audible Swallowing: Spontaneous and intermittent  Type of Nipple: Everted at rest and after stimulation(short shaft - areola more compressible after feeding )  Comfort (Breast/Nipple): Soft / non-tender  Hold (Positioning): Assistance needed to correctly position  infant at breast and maintain latch.  LATCH Score: 9  Interventions Interventions: Breast feeding basics reviewed;Assisted with latch;Skin to skin;Breast massage;Hand express;Reverse pressure;Breast compression;Adjust position;Support pillows;Position options;Shells;Comfort gels;Hand pump;DEBP  Lactation Tools Discussed/Used Tools: Shells;Pump;Nipple Shields;Comfort gels Nipple shield size: 20;24;Other (comment)(#20 NS started prior to [facility] visit - LC felt was to snug / #24 NS better fit ) Shell Type: Inverted Breast pump type: Double-Electric Breast Pump;Manual Pump Review: Setup, frequency, and cleaning(by RN )   Consult Status Consult Status: Follow-up Date: 04/26/18 Follow-up type: In-patient    [place] 04/25/2018, 6:23 PM

## 2018-04-25 NOTE — Anesthesia Postprocedure Evaluation (Signed)
Anesthesia Post Note  Patient: Christina Lopez  Procedure(s) Performed: AN AD [place]     Patient location during evaluation: Mother Baby Anesthesia Type: Epidural Level of consciousness: awake, awake and alert and oriented Pain management: pain level controlled Vital Signs Assessment: post-procedure vital signs reviewed and stable Respiratory status: spontaneous breathing and respiratory function stable Cardiovascular status: blood pressure returned to baseline Postop Assessment: no headache, no backache, epidural receding, patient able to bend at knees, no apparent nausea or vomiting, adequate PO intake and able to ambulate Anesthetic complications: no    Last Vitals:  Vitals:   04/25/18 0030 04/25/18 0430  BP: 103/61 113/74  Pulse: 72 67  Resp: 16 16  Temp: 36.7 C 36.4 C  SpO2: 97% 98%    Last Pain:  Vitals:   04/25/18 0600  TempSrc:   PainSc: 3    Pain Goal:                   Bufford Spikes

## 2018-04-26 LAB — RH IG WORKUP (INCLUDES ABO/RH)
ABO/RH(D): A NEG
Fetal Screen: NEGATIVE
Gestational Age(Wks): 40
Unit division: 0

## 2018-04-26 NOTE — Lactation Note (Signed)
This note was copied from a baby's chart. Lactation Consultation Note:  Staff nurse reports that mother has infant latched on the breast.  [name] arrived in the room. Mother using football hold on the left breast.  Infant sliding on and off the #24 NS.  Advised mother in proper application of the NS NS repositioned and adjusted infants position.   Scant amt of colostrum in the shield.  Advised mother to check shield for milk transfer.   Discussed proper latch technique. Infant latched on with a shallow latch.  Suckled on and off for 10 mins.  No observed swallows.   Assist mother with hand expression and infant was spoon . Infant was given 0.5 ml of ebm with a spoon.   Discussed post pumping for 15-20 mins and then page LC to offer ebm with spoon or syringe.  Discussed signs of dehydration. Staff nurse  reports that infants last diaper was discolored with uric acid crystals.   Discussed the use of formula if unable to pump or hand express ebm.  Parents paged staff nurse for formula .  Staff nurse gave mother supplemental guidelines.  Mother to continue to post pump and supplement.    Patient Name: Christina Lopez DPOEU'M Date: 04/26/2018 Reason for consult: Follow-up assessment   Maternal Data    Feeding Feeding Type: Breast Milk  LATCH Score Latch: Repeated attempts needed to sustain latch, nipple held in mouth throughout feeding, stimulation needed to elicit sucking reflex.  Audible Swallowing: None  Type of Nipple: Flat  Comfort (Breast/Nipple): Filling, red/small blisters or bruises, mild/mod discomfort(mom reports nipple pain scale of #4 )  Hold (Positioning): Assistance needed to correctly position infant at breast and maintain latch.  LATCH Score: 4  Interventions Interventions: Breast massage;Hand express;Breast compression;Adjust position;Support pillows;Position options;Expressed milk;Comfort gels;Hand pump;DEBP  Lactation Tools  Discussed/Used Nipple shield size: 24 Shell Type: Inverted Breast pump type: Double-Electric Breast Pump   Consult Status Consult Status: Follow-up Date: 04/26/18 Follow-up type: In-patient    [name] [facility] 04/26/2018, 10:49 AM

## 2018-04-26 NOTE — Discharge Instructions (Signed)
Vaginal Delivery, Care After °Refer to this sheet in the next few weeks. These instructions provide you with information about caring for yourself after vaginal delivery. Your health care provider may also give you more specific instructions. Your treatment has been planned according to current medical practices, but problems sometimes occur. Call your health care provider if you have any problems or questions. °What can I expect after the procedure? °After vaginal delivery, it is common to have: °· Some bleeding from your vagina. °· Soreness in your abdomen, your vagina, and the area of skin between your vaginal opening and your anus (perineum). °· Pelvic cramps. °· Fatigue. °Follow these instructions at home: °Medicines °· Take over-the-counter and prescription medicines only as told by your health care provider. °· If you were prescribed an antibiotic medicine, take it as told by your health care provider. Do not stop taking the antibiotic until it is finished. °Driving ° °· Do not drive or operate heavy machinery while taking prescription pain medicine. °· Do not drive for 24 hours if you received a sedative. °Lifestyle °· Do not drink alcohol. This is especially important if you are breastfeeding or taking medicine to relieve pain. °· Do not use tobacco products, including cigarettes, chewing tobacco, or e-cigarettes. If you need help quitting, ask your health care provider. °Eating and drinking °· Drink at least 8 eight-ounce glasses of water every day unless you are told not to by your health care provider. If you choose to breastfeed your baby, you may need to drink more water than this. °· Eat high-fiber foods every day. These foods may help prevent or relieve constipation. High-fiber foods include: °? Whole grain cereals and breads. °? Brown rice. °? Beans. °? Fresh fruits and vegetables. °Activity °· Return to your normal activities as told by your health care provider. Ask your health care provider what  activities are safe for you. °· Rest as much as possible. Try to rest or take a nap when your baby is sleeping. °· Do not lift anything that is heavier than your baby or 10 lb (4.5 kg) until your health care provider says that it is safe. °· Talk with your health care provider about when you can engage in sexual activity. This may depend on your: °? Risk of infection. °? Rate of healing. °? Comfort and desire to engage in sexual activity. °Vaginal Care °· If you have an episiotomy or a vaginal tear, check the area every day for signs of infection. Check for: °? More redness, swelling, or pain. °? More fluid or blood. °? Warmth. °? Pus or a bad smell. °· Do not use tampons or douches until your health care provider says this is safe. °· Watch for any blood clots that may pass from your vagina. These may look like clumps of dark red, brown, or black discharge. °General instructions °· Keep your perineum clean and dry as told by your health care provider. °· Wear loose, comfortable clothing. °· Wipe from front to back when you use the toilet. °· Ask your health care provider if you can shower or take a bath. If you had an episiotomy or a perineal tear during labor and delivery, your health care provider may tell you not to take baths for a certain length of time. °· Wear a bra that supports your breasts and fits you well. °· If possible, have someone help you with household activities and help care for your baby for at least a few days after you   leave the hospital. °· Keep all follow-up visits for you and your baby as told by your health care provider. This is important. °Contact a health care provider if: °· You have: °? Vaginal discharge that has a bad smell. °? Difficulty urinating. °? Pain when urinating. °? A sudden increase or decrease in the frequency of your bowel movements. °? More redness, swelling, or pain around your episiotomy or vaginal tear. °? More fluid or blood coming from your episiotomy or vaginal  tear. °? Pus or a bad smell coming from your episiotomy or vaginal tear. °? A fever. °? A rash. °? Little or no interest in activities you used to enjoy. °? Questions about caring for yourself or your baby. °· Your episiotomy or vaginal tear feels warm to the touch. °· Your episiotomy or vaginal tear is separating or does not appear to be healing. °· Your breasts are painful, hard, or turn red. °· You feel unusually sad or worried. °· You feel nauseous or you vomit. °· You pass large blood clots from your vagina. If you pass a blood clot from your vagina, save it to show to your health care provider. Do not flush blood clots down the toilet without having your health care provider look at them. °· You urinate more than usual. °· You are dizzy or light-headed. °· You have not breastfed at all and you have not had a menstrual period for 12 weeks after delivery. °· You have stopped breastfeeding and you have not had a menstrual period for 12 weeks after you stopped breastfeeding. °Get help right away if: °· You have: °? Pain that does not go away or does not get better with medicine. °? Chest pain. °? Difficulty breathing. °? Blurred vision or spots in your vision. °? Thoughts about hurting yourself or your baby. °· You develop pain in your abdomen or in one of your legs. °· You develop a severe headache. °· You faint. °· You bleed from your vagina so much that you fill two sanitary pads in one hour. °This information is not intended to replace advice given to you by your health care provider. Make sure you discuss any questions you have with your health care provider. °Document Released: 03/17/2000 Document Revised: 09/01/2015 Document Reviewed: 04/04/2015 °Elsevier Interactive Patient Education © 2019 [employer] Inc[employer] ° °

## 2018-04-26 NOTE — Discharge Summary (Signed)
OB Discharge Summary     Patient Name: Christina Lopez DOB: [birth year] MRN: [id number]  Date of admission: 04/24/2018 Delivering MD: Christophe Louis   Date of discharge: 04/26/2018  Admitting diagnosis: INDUCTION Intrauterine pregnancy: [redacted]w[redacted]d     Secondary diagnosis:  Active Problems:   Advanced maternal age, primigravida in third trimester, antepartum  Additional problems: None     Discharge diagnosis: Term Pregnancy Delivered                                                                                                Post partum procedures:None  Augmentation: AROM, Pitocin and Cytotec  Complications: None  Hospital course:  Induction of Labor With Vaginal Delivery   38 y.o. yo G1P1001 at [redacted]w[redacted]d was admitted to the hospital 04/24/2018 for induction of labor.  Indication for induction: AMA.  Patient had an uncomplicated labor course as follows: Membrane Rupture Time/Date: 1:20 PM ,04/24/2018   Intrapartum Procedures: Episiotomy: None [1]                                         Lacerations:  2nd degree [3];Perineal [11]  Patient had delivery of a Viable infant.  Information for the patient's newborn:  Anneta Rounds, Girl Elyshia [[id number]]  Delivery Method: Vag-Spont   04/24/2018  Details of delivery can be found in separate delivery note.  Patient had a routine postpartum course. Patient is discharged home 04/27/18.  Physical exam  Vitals:   04/25/18 0430 04/25/18 0830 04/25/18 1415 04/26/18 0014  BP: 113/74 113/77 129/79 102/64  Pulse: 67 72 76 64  Resp: 16 17 18 17   Temp: 97.6 F (36.4 C) 98.4 F (36.9 C) 98.3 F (36.8 C) 97.9 F (36.6 C)  TempSrc: Oral Oral Oral Oral  SpO2: 98%  99%   Weight:      Height:       General: alert, cooperative and no distress Lochia: appropriate Uterine Fundus: firm Incision: N/A DVT Evaluation: No evidence of DVT seen on physical exam. Calf/Ankle edema is present Labs: Lab Results  Component Value Date   WBC 10.2 04/25/2018    HGB 10.4 (L) 04/25/2018   HCT 31.3 (L) 04/25/2018   MCV 96.9 04/25/2018   PLT 164 04/25/2018   No flowsheet data found.  Discharge instruction: per After Visit Summary and "Baby and Me Booklet".  After visit meds:  Allergies as of 04/26/2018   No Known Allergies     Medication List    TAKE these medications   docusate sodium 250 MG capsule Commonly known as:  COLACE Take 250 mg by mouth daily.   ibuprofen 600 MG tablet Commonly known as:  ADVIL,MOTRIN Take 1 tablet (600 mg total) by mouth every 6 (six) hours as needed.   phenylephrine-shark liver oil-mineral oil-petrolatum 0.25-3-14-71.9 % rectal ointment Commonly known as:  PREPARATION H Place 1 application rectally 2 (two) times daily as needed for hemorrhoids.   polyethylene glycol packet Commonly known as:  MIRALAX / GLYCOLAX Take 17 g by  mouth daily.   prenatal multivitamin Tabs tablet Take 1 tablet by mouth at bedtime.       Diet: routine diet  Activity: Advance as tolerated. Pelvic rest for 6 weeks.   Outpatient follow up:6 weeks Follow up Appt:No future appointments. Follow up Visit:No follow-ups on file.  Postpartum contraception: Condoms and Discussed  Newborn Data: Live born female  Birth Weight: 6 lb 9.3 oz (2985 g) APGAR: 9, 9  Newborn Delivery   Birth date/time:  04/24/2018 17:11:00 Delivery type:  Vaginal, Spontaneous     Baby Feeding: Breast Disposition:home with mother   04/26/2018 Thurnell Lose, MD

## 2018-04-27 ENCOUNTER — Ambulatory Visit: Payer: Self-pay

## 2018-04-27 NOTE — Lactation Note (Signed)
This note was copied from a baby's chart. Lactation Consultation Note  Patient Name: Christina Lopez [id number]'[id number] Date: 04/27/2018 Reason for consult: Follow-up assessment;Term;Infant weight loss  Visited with P1 Mom of term baby at 77 hrs old.  Baby at 8% weight loss.   Due to difficult latch, Mom has been supplementing and pumping regularly.  Not expressing more than a ml of colostrum presently.   Nipples sore, using coconut oil and Comfort Gels alternately.    Baby being supplemented per volume guidelines, every 3 hrs maximum.  Using a curved tip syringe to supplement.  Discussed how to pace bottle feed.  Parents are saying that feedings are taking up to 1hr due to using a syringe.  Recommended they switch to paced bottle.  Encouraged lots of STS.   Encouraged renting a DEBP from gift shop as they have a single electric pump at home.  To call insurance company about obtaining a DEBP.   Talked about OP lactation follow-up.  IBCLC at [facility] will see Mom 1/27. Mom aware of OP lactation support available to her.    Interventions Interventions: Breast feeding basics reviewed;Skin to skin;Breast massage;Hand express;DEBP;Shells;Coconut oil;Comfort gels  Lactation Tools Discussed/Used Tools: Pump Nipple shield size: 24 Shell Type: Inverted Breast pump type: Double-Electric Breast Pump   Consult Status Consult Status: Complete Date: 04/27/18 Follow-up type: Call as needed    Broadus John 04/27/2018, 11:00 AM

## 2018-04-28 LAB — TYPE AND SCREEN
ABO/RH(D): A NEG
Antibody Screen: POSITIVE
UNIT DIVISION: 0
Unit division: 0

## 2018-04-28 LAB — BPAM RBC
Blood Product Expiration Date: 202002082359
Blood Product Expiration Date: 202002082359
UNIT TYPE AND RH: 600
Unit Type and Rh: 600

## 2018-08-16 DIAGNOSIS — N941 Unspecified dyspareunia: Secondary | ICD-10-CM | POA: Diagnosis not present

## 2018-08-16 DIAGNOSIS — N842 Polyp of vagina: Secondary | ICD-10-CM | POA: Diagnosis not present

## 2018-08-16 DIAGNOSIS — N952 Postmenopausal atrophic vaginitis: Secondary | ICD-10-CM | POA: Diagnosis not present

## 2018-08-28 ENCOUNTER — Other Ambulatory Visit: Payer: Self-pay | Admitting: Obstetrics and Gynecology

## 2018-08-28 DIAGNOSIS — N842 Polyp of vagina: Secondary | ICD-10-CM | POA: Diagnosis not present

## 2018-09-12 DIAGNOSIS — N842 Polyp of vagina: Secondary | ICD-10-CM | POA: Diagnosis not present

## 2019-04-21 DIAGNOSIS — L820 Inflamed seborrheic keratosis: Secondary | ICD-10-CM | POA: Diagnosis not present

## 2019-04-21 DIAGNOSIS — R208 Other disturbances of skin sensation: Secondary | ICD-10-CM | POA: Diagnosis not present

## 2019-04-21 DIAGNOSIS — D485 Neoplasm of uncertain behavior of skin: Secondary | ICD-10-CM | POA: Diagnosis not present

## 2019-05-08 ENCOUNTER — Institutional Professional Consult (permissible substitution): Payer: BLUE CROSS/BLUE SHIELD | Admitting: Plastic Surgery

## 2019-05-23 DIAGNOSIS — Z01419 Encounter for gynecological examination (general) (routine) without abnormal findings: Secondary | ICD-10-CM | POA: Diagnosis not present

## 2019-05-28 ENCOUNTER — Institutional Professional Consult (permissible substitution): Payer: BLUE CROSS/BLUE SHIELD | Admitting: Plastic Surgery

## 2019-06-12 ENCOUNTER — Ambulatory Visit (INDEPENDENT_AMBULATORY_CARE_PROVIDER_SITE_OTHER): Payer: BC Managed Care – PPO | Admitting: Plastic Surgery

## 2019-06-12 ENCOUNTER — Other Ambulatory Visit: Payer: Self-pay

## 2019-06-12 ENCOUNTER — Encounter: Payer: Self-pay | Admitting: Plastic Surgery

## 2019-06-12 VITALS — BP 142/93 | HR 80 | Temp 97.4°F | Ht 62.0 in | Wt 138.0 lb

## 2019-06-12 DIAGNOSIS — L989 Disorder of the skin and subcutaneous tissue, unspecified: Secondary | ICD-10-CM | POA: Diagnosis not present

## 2019-06-12 NOTE — Progress Notes (Signed)
   Referring Provider Copland, Gay Filler, MD [address] [place],  [place] [address]   CC: No chief complaint on file. Multiple facial lesions  Christina Lopez is an 39 y.o. female.  HPI: Patient is here to discuss multiple facial lesions.  They have been present for several years.  She has 1 on her right cheek along her jawline that is growing and catches on things like her mask.  She has another on her lower lip centrally that also bothers her.  She has another one on her left cheek and the nasolabial fold that smaller and occasionally bothers her.  No Known Allergies  Outpatient Encounter Medications as of 06/12/2019  Medication Sig  . docusate sodium (COLACE) 250 MG capsule Take 250 mg by mouth daily.  Marland Kitchen ibuprofen (ADVIL,MOTRIN) 600 MG tablet Take 1 tablet (600 mg total) by mouth every 6 (six) hours as needed.  . phenylephrine-shark liver oil-mineral oil-petrolatum (PREPARATION H) 0.25-3-14-71.9 % rectal ointment Place 1 application rectally 2 (two) times daily as needed for hemorrhoids.  . polyethylene glycol (MIRALAX / GLYCOLAX) packet Take 17 g by mouth daily.  . Prenatal Vit-Fe Fumarate-FA (PRENATAL MULTIVITAMIN) TABS tablet Take 1 tablet by mouth at bedtime.    No facility-administered encounter medications on file as of 06/12/2019.     Past Medical History:  Diagnosis Date  . Anemia     No past surgical history on file.  Family History  Problem Relation Age of Onset  . Cancer Father   . Depression Maternal Grandmother   . Breast cancer Maternal Grandmother   . Heart disease Paternal Grandmother   . Hypertension Paternal Grandmother   . Diabetes Paternal Grandfather     Social History   Social History Narrative  . Not on file     Review of Systems General: Denies fevers, chills, weight loss CV: Denies chest pain, shortness of breath, palpitations  Physical Exam Vitals with BMI 06/12/2019 04/26/2018 04/26/2018  Height 5\' 2"  - -  Weight 138  lbs - -  BMI A999333 - -  Systolic A999333 (No Data) A999333  Diastolic 93 (No Data) 64  Pulse 80 - 64    General:  No acute distress,  Alert and oriented, Non-Toxic, Normal speech and affect HEENT: Normocephalic atraumatic.  Extraocular wounds intact.  Cranial nerves grossly intact.  She has a 1 cm diameter pedunculated fleshy lesion in the right cheek jawline.  She has a 4 mm lesion right at the vermilion border of the lower lip centrally that is red in appearance and firm.  She has a flesh-colored small 1 to 2 mm raised lesion in the left nasolabial fold.  Assessment/Plan Patient presents with multiple skin lesions.  She was to have the 1 on the right cheek excised and she is to think about the one on the lower lip.  I think both warrant excision because they have been growing and are so raised that they catch on things.  We discussed the risks include bleeding, infection, demonstrating structures, need for additional procedures.  She definitely wants to move ahead with the right cheek lesion and is going to call us about the lip.  We will set her up for these to be done under local.  [name] 06/12/2019, 3:34 PM

## 2019-07-10 ENCOUNTER — Other Ambulatory Visit: Payer: Self-pay

## 2019-07-10 ENCOUNTER — Other Ambulatory Visit (HOSPITAL_COMMUNITY)
Admission: RE | Admit: 2019-07-10 | Discharge: 2019-07-10 | Disposition: A | Discharge status: Home / Self Care | Payer: BC Managed Care – PPO | Source: Ambulatory Visit | Attending: Plastic Surgery | Admitting: Plastic Surgery

## 2019-07-10 ENCOUNTER — Ambulatory Visit (INDEPENDENT_AMBULATORY_CARE_PROVIDER_SITE_OTHER): Payer: BC Managed Care – PPO | Admitting: Plastic Surgery

## 2019-07-10 ENCOUNTER — Encounter: Payer: Self-pay | Admitting: Plastic Surgery

## 2019-07-10 VITALS — BP 120/77 | HR 82 | Temp 98.7°F | Ht 62.0 in | Wt 138.0 lb

## 2019-07-10 DIAGNOSIS — L989 Disorder of the skin and subcutaneous tissue, unspecified: Secondary | ICD-10-CM

## 2019-07-10 DIAGNOSIS — D220 Melanocytic nevi of lip: Secondary | ICD-10-CM | POA: Diagnosis not present

## 2019-07-10 DIAGNOSIS — D2239 Melanocytic nevi of other parts of face: Secondary | ICD-10-CM | POA: Diagnosis not present

## 2019-07-10 NOTE — Progress Notes (Signed)
Operative Note   DATE OF OPERATION: 07/10/2019  LOCATION:    SURGICAL DEPARTMENT: Plastic Surgery  PREOPERATIVE DIAGNOSES: Right cheek and lower lip skin lesions  POSTOPERATIVE DIAGNOSES:  same  PROCEDURE:  1. Excision of right cheek skin lesion measuring 3 cm 2. Excision of lower lip skin lesion measuring 1.1 cm 3. Complex closure right cheek measuring 3 cm 4. Complex closure lower lip measuring 1.1 cm  SURGEON: Talmadge Coventry, MD  ANESTHESIA:  Local  COMPLICATIONS: None.   INDICATIONS FOR PROCEDURE:  The patient, Christina Lopez is a 39 y.o. female born on [birth year], is here for treatment of right cheek and lower lip skin lesions MRN: [id number]  CONSENT:  Informed consent was obtained directly from the patient. Risks, benefits and alternatives were fully discussed. Specific risks including but not limited to bleeding, infection, hematoma, seroma, scarring, pain, infection, wound healing problems, and need for further surgery were all discussed. The patient did have an ample opportunity to have questions answered to satisfaction.   DESCRIPTION OF PROCEDURE:  Local anesthesia was administered. The patient's operative site was prepped and draped in a sterile fashion. A time out was performed and all information was confirmed to be correct.  The lesions were excised with a 15 blade.  Hemostasis was obtained.  Circumferential undermining was performed and the skin was advanced and closed in layers with interrupted buried Monocryl sutures and 5-0 fast gut for the skin.  The lesion excised measured 3 cm for the cheek and 1.1 cm for the lip, and the total length of closure measured 3 cm for the cheek and 1.1 cm for the lip.    The patient tolerated the procedure well.  There were no complications.

## 2019-07-14 LAB — SURGICAL PATHOLOGY

## 2019-07-22 NOTE — Progress Notes (Signed)
Patient is a 39 year old female here for follow-up after excision of right cheek skin lesion (3 cm) and lower lip skin lesion (1.1 cm) and complex closures on 07/10/2019 with Dr. Claudia Desanctis.  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  No significant cytologic atypia is identified.  Patient reports she is doing well overall.  Denies F, HA, CP, SOB, N/V. Both incisions are healing well,c/d/i. No signs of infection, redness, drainage, hematoma/seroma. Incision on lip has 2 places that are slightly raised. Suspect this will flatten out a bit more as it heals. Removed suture knot from cheek incision.   May apply Vaseline or Aquaphor to both incisions to moisturize. Follow up in 1-2 months to check lip incision, may cancel if all has healed up to her satisfaction.  Call office with any questions/concerns.   The [place] was signed into law in 2016 which includes the topic of electronic health records.  This provides immediate access to information in MyChart.  This includes consultation notes, operative notes, office notes, lab results and pathology reports.  If you have any questions about what you read please let us know at your next visit or call us at the office.  We are right here with you.

## 2019-07-23 ENCOUNTER — Other Ambulatory Visit: Payer: Self-pay

## 2019-07-23 ENCOUNTER — Ambulatory Visit (INDEPENDENT_AMBULATORY_CARE_PROVIDER_SITE_OTHER): Payer: BC Managed Care – PPO | Admitting: Plastic Surgery

## 2019-07-23 ENCOUNTER — Encounter: Payer: Self-pay | Admitting: Plastic Surgery

## 2019-07-23 DIAGNOSIS — D229 Melanocytic nevi, unspecified: Secondary | ICD-10-CM | POA: Insufficient documentation

## 2019-07-23 HISTORY — DX: Melanocytic nevi, unspecified: D22.9

## 2019-07-24 ENCOUNTER — Ambulatory Visit: Payer: BC Managed Care – PPO | Admitting: Plastic Surgery

## 2019-10-21 ENCOUNTER — Encounter: Payer: Self-pay | Admitting: Family Medicine

## 2019-10-22 NOTE — Telephone Encounter (Signed)
Scheduled CPE for her and husband.

## 2019-11-02 NOTE — Progress Notes (Deleted)
[place] [facility] at [facility] [address] [place], [place] [address] [phone]  Date:  11/06/2019   Name:  Christina Lopez   DOB:  17-May-1980   MRN:  [id number]  PCP:  Darreld Mclean, MD    Chief Complaint: No chief complaint on file.   History of Present Illness:  Christina Lopez is a 39 y.o. very pleasant female patient who presents with the following:  Here today for a CPE Last seen by myself in 2019  Married to [place]  Can update labs today  covid series:  Patient Active Problem List   Diagnosis Date Noted  . Benign nevus of skin 07/23/2019  . Advanced maternal age, primigravida in third trimester, antepartum 04/24/2018    Past Medical History:  Diagnosis Date  . Anemia     No past surgical history on file.  Social History   Tobacco Use  . Smoking status: Never Smoker  . Smokeless tobacco: Never Used  Substance Use Topics  . Alcohol use: No    Alcohol/week: 0.0 standard drinks  . Drug use: Never    Family History  Problem Relation Age of Onset  . Cancer Father   . Depression Maternal Grandmother   . Breast cancer Maternal Grandmother   . Heart disease Paternal Grandmother   . Hypertension Paternal Grandmother   . Diabetes Paternal Grandfather     No Known Allergies  Medication list has been reviewed and updated.  Current Outpatient Medications on File Prior to Visit  Medication Sig Dispense Refill  . acetaminophen (TYLENOL) 500 MG tablet Take 500 mg by mouth every 6 (six) hours as needed.     No current facility-administered medications on file prior to visit.    Review of Systems:  As per HPI- otherwise negative.   Physical Examination: There were no vitals filed for this visit. There were no vitals filed for this visit. There is no height or weight on file to calculate BMI. Ideal Body Weight:    GEN: no acute distress. HEENT: Atraumatic, Normocephalic.  Ears and Nose: No  external deformity. CV: RRR, No M/G/R. No JVD. No thrill. No extra heart sounds. PULM: CTA B, no wheezes, crackles, rhonchi. No retractions. No resp. distress. No accessory muscle use. ABD: S, NT, ND, +BS. No rebound. No HSM. EXTR: No c/c/e PSYCH: Normally interactive. Conversant.    Assessment and Plan: *** This visit occurred during the SARS-CoV-2 public health emergency.  Safety protocols were in place, including screening questions prior to the visit, additional usage of staff PPE, and extensive cleaning of exam room while observing appropriate contact time as indicated for disinfecting solutions.    Signed Lamar Blinks, MD

## 2019-11-06 ENCOUNTER — Ambulatory Visit (INDEPENDENT_AMBULATORY_CARE_PROVIDER_SITE_OTHER): Payer: BC Managed Care – PPO | Admitting: Family Medicine

## 2019-11-06 DIAGNOSIS — Z131 Encounter for screening for diabetes mellitus: Secondary | ICD-10-CM | POA: Diagnosis not present

## 2019-11-06 DIAGNOSIS — Z13220 Encounter for screening for lipoid disorders: Secondary | ICD-10-CM

## 2019-11-06 DIAGNOSIS — Z1159 Encounter for screening for other viral diseases: Secondary | ICD-10-CM

## 2019-11-06 DIAGNOSIS — Z130 Encounter for screening for diseases of the blood and blood-forming organs and certain disorders involving the immune mechanism: Secondary | ICD-10-CM

## 2019-11-06 DIAGNOSIS — Z0000 Encounter for general adult medical examination without abnormal findings: Secondary | ICD-10-CM

## 2019-11-06 DIAGNOSIS — Z1329 Encounter for screening for other suspected endocrine disorder: Secondary | ICD-10-CM

## 2019-11-12 ENCOUNTER — Other Ambulatory Visit: Payer: Self-pay

## 2019-11-12 ENCOUNTER — Ambulatory Visit (INDEPENDENT_AMBULATORY_CARE_PROVIDER_SITE_OTHER): Payer: BC Managed Care – PPO | Admitting: Plastic Surgery

## 2019-11-12 ENCOUNTER — Encounter: Payer: Self-pay | Admitting: Plastic Surgery

## 2019-11-12 VITALS — BP 120/78 | HR 74 | Temp 97.7°F

## 2019-11-12 DIAGNOSIS — D229 Melanocytic nevi, unspecified: Secondary | ICD-10-CM | POA: Diagnosis not present

## 2019-11-12 NOTE — Progress Notes (Signed)
   Referring Provider Copland, Gay Filler, MD [address] [place],  [place] [address]   CC:  Chief Complaint  Patient presents with  . Follow-up      Christina Lopez is an 39 y.o. female.  HPI: Patient is here for months out from excision of facial lesions including one on her lower lip.  She is overall doing well but is concerned about a small bump at the lower aspect of her scar and wants me to check that.  Review of Systems General: Denies fevers or chills  Physical Exam Vitals with BMI 11/12/2019 07/23/2019 07/10/2019  Height - 5\' 2"  5\' 2"   Weight - 138 lbs 138 lbs  BMI - 34.28 76.81  Systolic 157 262 035  Diastolic 78 81 77  Pulse 74 80 82    General:  No acute distress,  Alert and oriented, Non-Toxic, Normal speech and affect On examination she has a fairly well-healed vertical scar along the vermilion border of the lower lip centrally.  The vermilion border is aligned nicely but there is a little very small dogear at the inferior aspect on the skin portion of her lower lip.  Assessment/Plan Patient presents with a small contour irregularity from a scar from excision of a lower lip lesion.  We discussed a number of options including continuing to observe this and dermabrasion.  At the moment she is content to watch this for another few months and I am hopeful that this will soften to the point that no revision procedure is necessary.  If it is dermabrasion or shave excision of that area would treat it nicely I believe.  She is getting come back in early November for her next appointment.  [name] 11/12/2019, 5:18 PM

## 2019-11-22 NOTE — Progress Notes (Addendum)
[facility] at [facility] [address], [place], [place] [address] [phone]  Date:  11/24/2019   Name:  Christina Lopez   DOB:  1980-11-09   MRN:  [id number]  PCP:  Darreld Mclean, MD    Chief Complaint: Annual Exam   History of Present Illness:  Christina Lopez is a 39 y.o. 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  She got pretty sick with the 2nd shot- very tired  They are finished with nursing - she plans to do her mammogram soon   Never smoker Exercise- she plans to work on this   Patient Active Problem List   Diagnosis Date Noted  . Benign nevus of skin 07/23/2019  . Advanced maternal age, primigravida in third trimester, antepartum 04/24/2018    Past Medical History:  Diagnosis Date  . Anemia     History reviewed. No pertinent surgical history.  Social History   Tobacco Use  . Smoking status: Never Smoker  . Smokeless tobacco: Never Used  Substance Use Topics  . Alcohol use: No    Alcohol/week: 0.0 standard drinks  . Drug use: Never    Family History  Problem Relation Age of Onset  . Cancer Father   . Depression Maternal Grandmother   . Breast cancer Maternal Grandmother   . Heart disease Paternal Grandmother   . Hypertension Paternal Grandmother   . Diabetes Paternal Grandfather     No Known Allergies  Medication list has been reviewed and updated.  Current Outpatient Medications on File Prior to Visit  Medication Sig Dispense Refill  .  acetaminophen (TYLENOL) 500 MG tablet Take 500 mg by mouth every 6 (six) hours as needed.     No current facility-administered medications on file prior to visit.    Review of Systems:  As per HPI- otherwise negative.   Physical Examination: Vitals:   11/24/19 1305  BP: 98/62  Pulse: 82  Temp: 99 F (37.2 C)  SpO2: 98%   Vitals:   11/24/19 1305  Weight: 137 lb 12.8 oz (62.5 kg)  Height: 5\' 2"  (1.575 m)   Body mass index is 25.2 kg/m. Ideal Body Weight: Weight in (lb) to have BMI = 25: 136.4  GEN: no acute distress.  Normal weight, looks well HEENT: Atraumatic, Normocephalic.   Bilateral TM wnl, oropharynx normal.  PEERL,EOMI.   Ears and Nose: No external deformity. CV: RRR, No M/G/R. No JVD. No thrill. No extra heart sounds. PULM: CTA B, no wheezes, crackles, rhonchi. No retractions. No resp. distress. No accessory muscle use. ABD: S, NT, ND, +BS. No rebound. No HSM. EXTR: No c/c/e PSYCH: Normally interactive. Conversant.   BP Readings from Last 3 Encounters:  11/24/19 98/62  11/12/19 120/78  07/23/19 117/81    Assessment and Plan: Physical exam  Screening for deficiency anemia - Plan: CBC  Screening for thyroid disorder - Plan: TSH  Screening for diabetes mellitus - Plan:  Comprehensive metabolic panel, Hemoglobin A1c  Screening for lipid disorders - Plan: Lipid panel  Encounter for hepatitis C screening test for low risk patient - Plan: Hepatitis C antibody  Patient here today for routine physical Doing very well, no concerns in general Routines labs are pending as above Discussed flu shot, Covid booster this fall Encourage exercise, healthy diet Will plan further follow- up pending labs.  This visit occurred during the SARS-CoV-2 public health emergency.  Safety protocols were in place, including screening questions prior to the visit, additional usage of staff PPE, and extensive cleaning of exam room while observing appropriate contact time as  indicated for disinfecting solutions.    Signed Lamar Blinks, MD   Received her labs as below, message to patient  Results for orders placed or performed in visit on 11/24/19  CBC  Result Value Ref Range   WBC 7.0 3.8 - 10.8 Thousand/uL   RBC 4.26 3.80 - 5.10 Million/uL   Hemoglobin 13.2 11.7 - 15.5 g/dL   HCT 39.5 35 - 45 %   MCV 92.7 80.0 - 100.0 fL   MCH 31.0 27.0 - 33.0 pg   MCHC 33.4 32.0 - 36.0 g/dL   RDW 12.3 11.0 - 15.0 %   Platelets 244 140 - 400 Thousand/uL   MPV 12.8 (H) 7.5 - 12.5 fL  Comprehensive metabolic panel  Result Value Ref Range   Glucose, Bld 103 (H) 65 - 99 mg/dL   BUN 19 7 - 25 mg/dL   Creat 0.93 0.50 - 1.10 mg/dL   BUN/Creatinine Ratio NOT APPLICABLE 6 - 22 (calc)   Sodium 140 135 - 146 mmol/L   Potassium 4.4 3.5 - 5.3 mmol/L   Chloride 104 98 - 110 mmol/L   CO2 27 20 - 32 mmol/L   Calcium 9.5 8.6 - 10.2 mg/dL   Total Protein 7.6 6.1 - 8.1 g/dL   Albumin 4.4 3.6 - 5.1 g/dL   Globulin 3.2 1.9 - 3.7 g/dL (calc)   AG Ratio 1.4 1.0 - 2.5 (calc)   Total Bilirubin 0.8 0.2 - 1.2 mg/dL   Alkaline phosphatase (APISO) 38 31 - 125 U/L   AST 15 10 - 30 U/L   ALT 14 6 - 29 U/L  Hemoglobin A1c  Result Value Ref Range   Hgb A1c MFr Bld 5.3 <5.7 % of total Hgb   Mean Plasma Glucose 105 (calc)   eAG (mmol/L) 5.8 (calc)  Lipid panel  Result Value Ref Range   Cholesterol 240 (H) <200 mg/dL   HDL 64 > OR = 50 mg/dL   Triglycerides 79 <150 mg/dL   LDL Cholesterol (Calc) 158 (H) mg/dL (calc)   Total CHOL/HDL Ratio 3.8 <5.0 (calc)   Non-HDL Cholesterol (Calc) 176 (H) <130 mg/dL (calc)  TSH  Result Value Ref Range   TSH 1.30 mIU/L   The 10-year ASCVD risk score Mikey Bussing DC Jr., et al., 2013) is: 0.4%   Values used to calculate the score:     Age: 79 years     Sex: Female     Is Non-Hispanic African American: No     Diabetic: No     Tobacco smoker: No     Systolic Blood Pressure: 98 mmHg     Is BP treated: No     HDL Cholesterol: 64 mg/dL     Total  Cholesterol: 240 mg/dL

## 2019-11-22 NOTE — Patient Instructions (Addendum)
It was good to see you again today!   I will be in touch with your labs asap Work on scheduling your mammogram and on getting regular exercise  Plan for flu shot this fall and covid booster as well   Health Maintenance, Female Adopting a healthy lifestyle and getting preventive care are important in promoting health and wellness. Ask your health care provider about:  The right schedule for you to have regular tests and exams.  Things you can do on your own to prevent diseases and keep yourself healthy. What should I know about diet, weight, and exercise? Eat a healthy diet   Eat a diet that includes plenty of vegetables, fruits, low-fat dairy products, and lean protein.  Do not eat a lot of foods that are high in solid fats, added sugars, or sodium. Maintain a healthy weight Body mass index (BMI) is used to identify weight problems. It estimates body fat based on height and weight. Your health care provider can help determine your BMI and help you achieve or maintain a healthy weight. Get regular exercise Get regular exercise. This is one of the most important things you can do for your health. Most adults should:  Exercise for at least 150 minutes each week. The exercise should increase your heart rate and make you sweat (moderate-intensity exercise).  Do strengthening exercises at least twice a week. This is in addition to the moderate-intensity exercise.  Spend less time sitting. Even light physical activity can be beneficial. Watch cholesterol and blood lipids Have your blood tested for lipids and cholesterol at 41 years of age, then have this test every 5 years. Have your cholesterol levels checked more often if:  Your lipid or cholesterol levels are high.  You are older than 41 years of age.  You are at high risk for heart disease. What should I know about cancer screening? Depending on your health history and family history, you may need to have cancer screening at  various ages. This may include screening for:  Breast cancer.  Cervical cancer.  Colorectal cancer.  Skin cancer.  Lung cancer. What should I know about heart disease, diabetes, and high blood pressure? Blood pressure and heart disease  High blood pressure causes heart disease and increases the risk of stroke. This is more likely to develop in people who have high blood pressure readings, are of African descent, or are overweight.  Have your blood pressure checked: ? Every 3-5 years if you are 51-71 years of age. ? Every year if you are 29 years old or older. Diabetes Have regular diabetes screenings. This checks your fasting blood sugar level. Have the screening done:  Once every three years after age 16 if you are at a normal weight and have a low risk for diabetes.  More often and at a younger age if you are overweight or have a high risk for diabetes. What should I know about preventing infection? Hepatitis B If you have a higher risk for hepatitis B, you should be screened for this virus. Talk with your health care provider to find out if you are at risk for hepatitis B infection. Hepatitis C Testing is recommended for:  Everyone born from [birth year] through 1967.  Anyone with known risk factors for hepatitis C. Sexually transmitted infections (STIs)  Get screened for STIs, including gonorrhea and chlamydia, if: ? You are sexually active and are younger than 41 years of age. ? You are older than 41 years of age and  your health care provider tells you that you are at risk for this type of infection. ? Your sexual activity has changed since you were last screened, and you are at increased risk for chlamydia or gonorrhea. Ask your health care provider if you are at risk.  Ask your health care provider about whether you are at high risk for HIV. Your health care provider may recommend a prescription medicine to help prevent HIV infection. If you choose to take medicine to prevent  HIV, you should first get tested for HIV. You should then be tested every 3 months for as long as you are taking the medicine. Pregnancy  If you are about to stop having your period (premenopausal) and you may become pregnant, seek counseling before you get pregnant.  Take 400 to 800 micrograms (mcg) of folic acid every day if you become pregnant.  Ask for birth control (contraception) if you want to prevent pregnancy. Osteoporosis and menopause Osteoporosis is a disease in which the bones lose minerals and strength with aging. This can result in bone fractures. If you are 17 years old or older, or if you are at risk for osteoporosis and fractures, ask your health care provider if you should:  Be screened for bone loss.  Take a calcium or vitamin D supplement to lower your risk of fractures.  Be given hormone replacement therapy (HRT) to treat symptoms of menopause. Follow these instructions at home: Lifestyle  Do not use any products that contain nicotine or tobacco, such as cigarettes, e-cigarettes, and chewing tobacco. If you need help quitting, ask your health care provider.  Do not use street drugs.  Do not share needles.  Ask your health care provider for help if you need support or information about quitting drugs. Alcohol use  Do not drink alcohol if: ? Your health care provider tells you not to drink. ? You are pregnant, may be pregnant, or are planning to become pregnant.  If you drink alcohol: ? Limit how much you use to 0-1 drink a day. ? Limit intake if you are breastfeeding.  Be aware of how much alcohol is in your drink. In the [place] one drink equals one 12 oz bottle of beer (355 mL), one 5 oz glass of wine (148 mL), or one 1 oz glass of hard liquor (44 mL). General instructions  Schedule regular health, dental, and eye exams.  Stay current with your vaccines.  Tell your health care provider if: ? You often feel depressed. ? You have ever been abused or do  not feel safe at home. Summary  Adopting a healthy lifestyle and getting preventive care are important in promoting health and wellness.  Follow your health care provider's instructions about healthy diet, exercising, and getting tested or screened for diseases.  Follow your health care provider's instructions on monitoring your cholesterol and blood pressure. This information is not intended to replace advice given to you by your health care provider. Make sure you discuss any questions you have with your health care provider. Document Revised: 03/13/2018 Document Reviewed: 03/13/2018 Elsevier Patient Education  2020 [employer].

## 2019-11-24 ENCOUNTER — Encounter: Payer: Self-pay | Admitting: Family Medicine

## 2019-11-24 ENCOUNTER — Ambulatory Visit (INDEPENDENT_AMBULATORY_CARE_PROVIDER_SITE_OTHER): Payer: BC Managed Care – PPO | Admitting: Family Medicine

## 2019-11-24 ENCOUNTER — Other Ambulatory Visit: Payer: Self-pay

## 2019-11-24 VITALS — BP 98/62 | HR 82 | Temp 99.0°F | Ht 62.0 in | Wt 137.8 lb

## 2019-11-24 DIAGNOSIS — Z130 Encounter for screening for diseases of the blood and blood-forming organs and certain disorders involving the immune mechanism: Secondary | ICD-10-CM | POA: Diagnosis not present

## 2019-11-24 DIAGNOSIS — Z0000 Encounter for general adult medical examination without abnormal findings: Secondary | ICD-10-CM

## 2019-11-24 DIAGNOSIS — Z1329 Encounter for screening for other suspected endocrine disorder: Secondary | ICD-10-CM

## 2019-11-24 DIAGNOSIS — Z1159 Encounter for screening for other viral diseases: Secondary | ICD-10-CM

## 2019-11-24 DIAGNOSIS — Z131 Encounter for screening for diabetes mellitus: Secondary | ICD-10-CM | POA: Diagnosis not present

## 2019-11-24 DIAGNOSIS — Z13220 Encounter for screening for lipoid disorders: Secondary | ICD-10-CM

## 2019-11-25 ENCOUNTER — Encounter: Payer: Self-pay | Admitting: Family Medicine

## 2019-11-25 LAB — COMPREHENSIVE METABOLIC PANEL
AG Ratio: 1.4 (calc) (ref 1.0–2.5)
ALT: 14 U/L (ref 6–29)
AST: 15 U/L (ref 10–30)
Albumin: 4.4 g/dL (ref 3.6–5.1)
Alkaline phosphatase (APISO): 38 U/L (ref 31–125)
BUN: 19 mg/dL (ref 7–25)
CO2: 27 mmol/L (ref 20–32)
Calcium: 9.5 mg/dL (ref 8.6–10.2)
Chloride: 104 mmol/L (ref 98–110)
Creat: 0.93 mg/dL (ref 0.50–1.10)
Globulin: 3.2 g/dL (calc) (ref 1.9–3.7)
Glucose, Bld: 103 mg/dL — ABNORMAL HIGH (ref 65–99)
Potassium: 4.4 mmol/L (ref 3.5–5.3)
Sodium: 140 mmol/L (ref 135–146)
Total Bilirubin: 0.8 mg/dL (ref 0.2–1.2)
Total Protein: 7.6 g/dL (ref 6.1–8.1)

## 2019-11-25 LAB — CBC
HCT: 39.5 % (ref 35.0–45.0)
Hemoglobin: 13.2 g/dL (ref 11.7–15.5)
MCH: 31 pg (ref 27.0–33.0)
MCHC: 33.4 g/dL (ref 32.0–36.0)
MCV: 92.7 fL (ref 80.0–100.0)
MPV: 12.8 fL — ABNORMAL HIGH (ref 7.5–12.5)
Platelets: 244 10*3/uL (ref 140–400)
RBC: 4.26 10*6/uL (ref 3.80–5.10)
RDW: 12.3 % (ref 11.0–15.0)
WBC: 7 10*3/uL (ref 3.8–10.8)

## 2019-11-25 LAB — LIPID PANEL
Cholesterol: 240 mg/dL — ABNORMAL HIGH (ref <200)
HDL: 64 mg/dL (ref >=50)
LDL Cholesterol (Calc): 158 mg/dL (calc) — ABNORMAL HIGH
Non-HDL Cholesterol (Calc): 176 mg/dL (calc) — ABNORMAL HIGH (ref <130)
Total CHOL/HDL Ratio: 3.8 (calc) (ref <5.0)
Triglycerides: 79 mg/dL (ref <150)

## 2019-11-25 LAB — HEPATITIS C ANTIBODY
Hepatitis C Ab: NONREACTIVE
SIGNAL TO CUT-OFF: 0.01 (ref <1.00)

## 2019-11-25 LAB — TSH: TSH: 1.3 mIU/L

## 2019-11-25 LAB — HEMOGLOBIN A1C
Hgb A1c MFr Bld: 5.3 % of total Hgb (ref <5.7)
Mean Plasma Glucose: 105 (calc)
eAG (mmol/L): 5.8 (calc)

## 2020-01-19 ENCOUNTER — Other Ambulatory Visit: Payer: Self-pay | Admitting: Obstetrics and Gynecology

## 2020-01-19 DIAGNOSIS — Z1231 Encounter for screening mammogram for malignant neoplasm of breast: Secondary | ICD-10-CM

## 2020-01-20 ENCOUNTER — Other Ambulatory Visit: Payer: Self-pay

## 2020-01-20 ENCOUNTER — Ambulatory Visit
Admission: RE | Admit: 2020-01-20 | Discharge: 2020-01-20 | Disposition: A | Discharge status: Home / Self Care | Payer: BC Managed Care – PPO | Source: Ambulatory Visit | Attending: Obstetrics and Gynecology | Admitting: Obstetrics and Gynecology

## 2020-01-20 DIAGNOSIS — Z1231 Encounter for screening mammogram for malignant neoplasm of breast: Secondary | ICD-10-CM | POA: Diagnosis not present

## 2020-01-23 ENCOUNTER — Other Ambulatory Visit: Payer: Self-pay | Admitting: Obstetrics and Gynecology

## 2020-01-23 ENCOUNTER — Other Ambulatory Visit: Payer: Self-pay | Admitting: Family Medicine

## 2020-01-23 DIAGNOSIS — R928 Other abnormal and inconclusive findings on diagnostic imaging of breast: Secondary | ICD-10-CM

## 2020-01-26 ENCOUNTER — Ambulatory Visit
Admission: RE | Admit: 2020-01-26 | Discharge: 2020-01-26 | Disposition: A | Discharge status: Home / Self Care | Payer: BC Managed Care – PPO | Source: Ambulatory Visit | Attending: Obstetrics and Gynecology | Admitting: Obstetrics and Gynecology

## 2020-01-26 ENCOUNTER — Other Ambulatory Visit: Payer: Self-pay | Admitting: Family Medicine

## 2020-01-26 ENCOUNTER — Other Ambulatory Visit: Payer: Self-pay

## 2020-01-26 DIAGNOSIS — R928 Other abnormal and inconclusive findings on diagnostic imaging of breast: Secondary | ICD-10-CM

## 2020-01-26 DIAGNOSIS — R922 Inconclusive mammogram: Secondary | ICD-10-CM | POA: Diagnosis not present

## 2020-01-26 DIAGNOSIS — N6021 Fibroadenosis of right breast: Secondary | ICD-10-CM | POA: Diagnosis not present

## 2020-02-11 ENCOUNTER — Ambulatory Visit: Payer: BC Managed Care – PPO | Admitting: Plastic Surgery

## 2020-03-10 ENCOUNTER — Ambulatory Visit: Payer: BC Managed Care – PPO | Admitting: Plastic Surgery

## 2020-03-28 NOTE — Progress Notes (Signed)
Patient is a 39 year old female here for follow-up after undergoing excision of right cheek skin lesion (3 cm) and lower lip skin lesion, (1.1 cm) on 07/10/2019 with Dr. Claudia Desanctis.  At last visit on 8/11 she had a fairly well-healed vertical scar along the vermilion border on the lower lip centrally.  The vermilion border was aligned nicely but there was a very small dogear at the inferior aspect of the skin portion of her lower lip.  Plan was to give it a few more months to heal otherwise revision could be considered.  ~ 8.5 mo PO Patient reports she is doing well.  Denies Fever, chills, nausea and vomiting. Non-labored breathing and regular pulse. She still has a small dogear at the inferior aspect of the skin portion of her lower lip incision. It has an almost pustule like appearance. Reports she discussed possible Microdermabrasion for this dog ear with Dr Claudia Desanctis and would like to wait a little longer before pursuing that option. She also is concerned about the mild acne scarring on her cheeks and uneven texture. Recommend Fraxel laser to address this area of concern.  ~ 3 treatments spaced at least 4 weeks apart. Patient is interested in this and will check timing of her trip to [place] to visit family and call us for scheduling. (Either this winter or this coming fall as she'd like to avoid the summer months). Patient asked for recommendations for sunscreen since she has sensitive acne prone skin.  Recommend Elta MD UV Clear (regular or tinted); provided a sample of each for her to try.   Call office to schedule Fraxel treatment. Follow up as needed for lower lip scar. Call office with any questions/concerns.

## 2020-03-31 ENCOUNTER — Other Ambulatory Visit: Payer: Self-pay

## 2020-03-31 ENCOUNTER — Ambulatory Visit (INDEPENDENT_AMBULATORY_CARE_PROVIDER_SITE_OTHER): Payer: BC Managed Care – PPO | Admitting: Plastic Surgery

## 2020-03-31 ENCOUNTER — Encounter: Payer: Self-pay | Admitting: Plastic Surgery

## 2020-03-31 VITALS — BP 124/72 | HR 74 | Temp 99.2°F

## 2020-03-31 DIAGNOSIS — L989 Disorder of the skin and subcutaneous tissue, unspecified: Secondary | ICD-10-CM

## 2020-05-05 DIAGNOSIS — Z01419 Encounter for gynecological examination (general) (routine) without abnormal findings: Secondary | ICD-10-CM | POA: Diagnosis not present

## 2020-06-02 NOTE — Progress Notes (Addendum)
[place] at [facility] [address] [place], [place] [address] [phone]  Date:  06/03/2020   Name:  Christina Lopez   DOB:  [birth year]   MRN:  [id number]  PCP:  Darreld Mclean, MD    Chief Complaint: Dizziness (Dizziness after getting up, vomiting, syncope hit head)   History of Present Illness:  Christina Lopez is a 40 y.o. very pleasant female patient who presents with the following:  Pt here today with concern of syncope Last seen by myself in August for CPE Generally in good health Married to [place], they have a 29 year old daughter who is healthy   Labs done in August   covid booster- done per pt 2 weeks ago  Flu vaccine- done per pt   Today is Thursday Monday she woke up feeling a bit nauseated.  Did not seem to be anyting major She got up out of bed and went to sit on the toilet (Her daughter had recently had a GI bug with vomiting and diarrhea) Pt had LOC while on the toilet- she awoke to find herself stuck between the toilet and the bathroom wall She hit her left face and head -has a bruise on her forehead and a mild black eye She is not quite sure how long she was out or if any urinary incontinence She had vomiting after the episode - this lasted for a few hours and then resolved  Christina Lopez has had syncope on rare occasions since she was a child She estimates she has had about 10 eipsodes over her lifetime She fainted in 2015 during an episode of pyelonephritis,  She had a work-up with what sounds like a holter monitor or an echo-these tests were done at [facility], I am not able to view the attachment A couple of years after that she fainted after cutting her hand  She notes that as she has gotten older she will typically vomit a few times after she faints She currently feels back to normal, no particular headache or other symptoms She has never been noted to have any seizure activity  Patient  Active Problem List   Diagnosis Date Noted  . Benign nevus of skin 07/23/2019  . Advanced maternal age, primigravida in third trimester, antepartum 04/24/2018    Past Medical History:  Diagnosis Date  . Anemia     History reviewed. No pertinent surgical history.  Social History   Tobacco Use  . Smoking status: Never Smoker  . Smokeless tobacco: Never Used  Substance Use Topics  . Alcohol use: No    Alcohol/week: 0.0 standard drinks  . Drug use: Never    Family History  Problem Relation Age of Onset  . Cancer Father   . Depression Maternal Grandmother   . Breast cancer Maternal Grandmother   . Heart disease Paternal Grandmother   . Hypertension Paternal Grandmother   . Diabetes Paternal Grandfather     No Known Allergies  Medication list has been reviewed and updated.  No current outpatient medications on file prior to visit.   No current facility-administered medications on file prior to visit.    Review of Systems:  As per HPI- otherwise negative.   Physical Examination: Vitals:   06/03/20 1039  BP: 126/80  Pulse: 83  Resp: 16  Temp: 98 F (36.7 C)  SpO2: 98%   Vitals:   06/03/20 1039  Weight: 139 lb (63 kg)  Height:  5\' 2"  (1.575 m)   Body mass index is 25.42 kg/m. Ideal Body Weight: Weight in (lb) to have BMI = 25: 136.4  GEN: no acute distress.  Normal weight, looks well HEENT: Atraumatic, Normocephalic.  She has a bruise of her left hairline, trace of a black eye on the left Bilateral TM wnl, oropharynx normal.  PEERL,EOMI.   Ears and Nose: No external deformity. CV: RRR, No M/G/R. No JVD. No thrill. No extra heart sounds. PULM: CTA B, no wheezes, crackles, rhonchi. No retractions. No resp. distress. No accessory muscle use. ABD: S, NT, ND, +BS. No rebound. No HSM. EXTR: No c/c/e PSYCH: Normally interactive. Conversant.  Normal strength, sensation, DTR of all limbs.  Normal Romberg  EKG shows normal sinus rhythm, rate of 65.  No ST  changes  Results for orders placed or performed in visit on 06/03/20  POCT urine pregnancy  Result Value Ref Range   Preg Test, Ur Negative Negative    Assessment and Plan: Syncope, unspecified syncope type - Plan: POCT urine pregnancy, EKG 12-Lead, CBC, Comprehensive metabolic panel, TSH, Ambulatory referral to Cardiology, Ambulatory referral to Neurology, CANCELED: CBC, CANCELED: Comprehensive metabolic panel, CANCELED: TSH  Patient is here today with occasional episodes of syncope since childhood.  She has never been noted to have any seizure activity.  Most recent episode occurred 4 days ago when she was on the toilet and not feeling well.  She also describes fainting a few years ago after she cut her hand.  This may be vasovagal in nature.  She did vomit several times after she fainted, she states this is normal for her after syncopal episode.  We discussed getting a CT scan as she did bump her head on the wall.  At this time she declines to get a CT, but does agree to seek further care if vomiting returns or if she gets a headache. For the time being we will get labs as above, will make referrals to neurology and cardiology in an attempt to find the cause of her occasional syncope If any other symptoms occur, or any changes she will contact me in the meantime  This visit occurred during the SARS-CoV-2 public health emergency.  Safety protocols were in place, including screening questions prior to the visit, additional usage of staff PPE, and extensive cleaning of exam room while observing appropriate contact time as indicated for disinfecting solutions.     Signed Lamar Blinks, MD  Received her labs as below, message to patient  Results for orders placed or performed in visit on 06/03/20  CBC  Result Value Ref Range   WBC 7.2 4.0 - 10.5 K/uL   RBC 3.99 3.87 - 5.11 Mil/uL   Platelets 240.0 150.0 - 400.0 K/uL   Hemoglobin 12.2 12.0 - 15.0 g/dL   HCT 36.7 36.0 - 46.0 %   MCV  91.8 78.0 - 100.0 fl   MCHC 33.2 30.0 - 36.0 g/dL   RDW 13.6 11.5 - 15.5 %  Comprehensive metabolic panel  Result Value Ref Range   Sodium 138 135 - 145 mEq/L   Potassium 4.1 3.5 - 5.1 mEq/L   Chloride 104 96 - 112 mEq/L   CO2 29 19 - 32 mEq/L   Glucose, Bld 85 70 - 99 mg/dL   BUN 15 6 - 23 mg/dL   Creatinine, Ser 0.69 0.40 - 1.20 mg/dL   Total Bilirubin 1.1 0.2 - 1.2 mg/dL   Alkaline Phosphatase 35 (L) 39 - 117 U/L  AST 15 0 - 37 U/L   ALT 12 0 - 35 U/L   Total Protein 7.3 6.0 - 8.3 g/dL   Albumin 4.3 3.5 - 5.2 g/dL   GFR 107.30 >60.00 mL/min   Calcium 9.2 8.4 - 10.5 mg/dL  TSH  Result Value Ref Range   TSH 1.16 0.35 - 4.50 uIU/mL  POCT urine pregnancy  Result Value Ref Range   Preg Test, Ur Negative Negative

## 2020-06-02 NOTE — Patient Instructions (Addendum)
Good to see you again today  I will be in touch with your labs and we will refer you to see cardiology and neurology IF you have any more vomiting or severe headache please seek care right away

## 2020-06-03 ENCOUNTER — Ambulatory Visit (INDEPENDENT_AMBULATORY_CARE_PROVIDER_SITE_OTHER): Payer: BC Managed Care – PPO | Admitting: Family Medicine

## 2020-06-03 ENCOUNTER — Encounter: Payer: Self-pay | Admitting: Family Medicine

## 2020-06-03 ENCOUNTER — Other Ambulatory Visit: Payer: Self-pay

## 2020-06-03 VITALS — BP 126/80 | HR 83 | Temp 98.0°F | Resp 16 | Ht 62.0 in | Wt 139.0 lb

## 2020-06-03 DIAGNOSIS — R55 Syncope and collapse: Secondary | ICD-10-CM | POA: Diagnosis not present

## 2020-06-03 LAB — TSH: TSH: 1.16 u[IU]/mL (ref 0.35–4.50)

## 2020-06-03 LAB — COMPREHENSIVE METABOLIC PANEL
ALT: 12 U/L (ref 0–35)
AST: 15 U/L (ref 0–37)
Albumin: 4.3 g/dL (ref 3.5–5.2)
Alkaline Phosphatase: 35 U/L — ABNORMAL LOW (ref 39–117)
BUN: 15 mg/dL (ref 6–23)
CO2: 29 mEq/L (ref 19–32)
Calcium: 9.2 mg/dL (ref 8.4–10.5)
Chloride: 104 mEq/L (ref 96–112)
Creatinine, Ser: 0.69 mg/dL (ref 0.40–1.20)
GFR: 107.3 mL/min (ref >60.00)
Glucose, Bld: 85 mg/dL (ref 70–99)
Potassium: 4.1 mEq/L (ref 3.5–5.1)
Sodium: 138 mEq/L (ref 135–145)
Total Bilirubin: 1.1 mg/dL (ref 0.2–1.2)
Total Protein: 7.3 g/dL (ref 6.0–8.3)

## 2020-06-03 LAB — CBC
HCT: 36.7 % (ref 36.0–46.0)
Hemoglobin: 12.2 g/dL (ref 12.0–15.0)
MCHC: 33.2 g/dL (ref 30.0–36.0)
MCV: 91.8 fl (ref 78.0–100.0)
Platelets: 240 10*3/uL (ref 150.0–400.0)
RBC: 3.99 Mil/uL (ref 3.87–5.11)
RDW: 13.6 % (ref 11.5–15.5)
WBC: 7.2 10*3/uL (ref 4.0–10.5)

## 2020-06-03 LAB — POCT URINE PREGNANCY: Preg Test, Ur: NEGATIVE

## 2020-06-23 ENCOUNTER — Encounter: Payer: Self-pay | Admitting: General Practice

## 2020-07-01 ENCOUNTER — Ambulatory Visit: Payer: BC Managed Care – PPO | Admitting: Neurology

## 2020-07-27 ENCOUNTER — Other Ambulatory Visit: Payer: Self-pay

## 2020-07-27 DIAGNOSIS — D649 Anemia, unspecified: Secondary | ICD-10-CM | POA: Insufficient documentation

## 2020-07-27 DIAGNOSIS — N941 Unspecified dyspareunia: Secondary | ICD-10-CM

## 2020-07-27 DIAGNOSIS — K219 Gastro-esophageal reflux disease without esophagitis: Secondary | ICD-10-CM

## 2020-07-27 DIAGNOSIS — N952 Postmenopausal atrophic vaginitis: Secondary | ICD-10-CM

## 2020-07-27 HISTORY — DX: Gastro-esophageal reflux disease without esophagitis: K21.9

## 2020-07-27 HISTORY — DX: Postmenopausal atrophic vaginitis: N95.2

## 2020-07-27 HISTORY — DX: Unspecified dyspareunia: N94.10

## 2020-07-30 ENCOUNTER — Inpatient Hospital Stay: Admission: RE | Admit: 2020-07-30 | Payer: BC Managed Care – PPO | Source: Ambulatory Visit

## 2020-08-02 ENCOUNTER — Ambulatory Visit: Payer: BC Managed Care – PPO | Admitting: Cardiology

## 2020-08-06 ENCOUNTER — Encounter: Payer: Self-pay | Admitting: Family Medicine

## 2020-09-01 ENCOUNTER — Other Ambulatory Visit: Payer: Self-pay | Admitting: Family Medicine

## 2020-09-01 ENCOUNTER — Ambulatory Visit
Admission: RE | Admit: 2020-09-01 | Discharge: 2020-09-01 | Disposition: A | Discharge status: Home / Self Care | Payer: BC Managed Care – PPO | Source: Ambulatory Visit | Attending: Family Medicine | Admitting: Family Medicine

## 2020-09-01 ENCOUNTER — Other Ambulatory Visit: Payer: Self-pay

## 2020-09-01 DIAGNOSIS — R928 Other abnormal and inconclusive findings on diagnostic imaging of breast: Secondary | ICD-10-CM

## 2020-09-13 ENCOUNTER — Encounter: Payer: Self-pay | Admitting: Neurology

## 2020-09-13 ENCOUNTER — Other Ambulatory Visit: Payer: Self-pay

## 2020-09-13 ENCOUNTER — Ambulatory Visit (INDEPENDENT_AMBULATORY_CARE_PROVIDER_SITE_OTHER): Payer: BLUE CROSS/BLUE SHIELD | Admitting: Neurology

## 2020-09-13 VITALS — BP 124/80 | HR 74 | Ht 62.0 in | Wt 135.0 lb

## 2020-09-13 DIAGNOSIS — R55 Syncope and collapse: Secondary | ICD-10-CM

## 2020-09-13 DIAGNOSIS — I951 Orthostatic hypotension: Secondary | ICD-10-CM

## 2020-09-13 HISTORY — DX: Orthostatic hypotension: I95.1

## 2020-09-13 NOTE — Patient Instructions (Signed)
Syncope  Syncope refers to a condition in which a person temporarily loses consciousness. Syncope may also be called fainting or passing out. It is caused by a sudden decrease in blood flow to the brain. Even though most causes of syncope are not dangerous, syncope can be a sign of a serious medical problem. Your health care provider may do tests to find the reason why you are havingsyncope. Signs that you may be about to faint include: Feeling dizzy or light-headed. Feeling nauseous. Seeing all white or all black in your field of vision. Having cold, clammy skin. If you faint, get medical help right away. Call your local emergency services (911 in the [place] Do not drive yourself to the hospital. Follow these instructions at home: Pay attention to any changes in your symptoms. Take these actions to stay safeand to help relieve your symptoms: Lifestyle Do not drive, use machinery, or play sports until your health care provider says it is okay. Do not drink alcohol. Do not use any products that contain nicotine or tobacco, such as cigarettes and e-cigarettes. If you need help quitting, ask your health care provider. Drink enough fluid to keep your urine pale yellow. General instructions Take over-the-counter and prescription medicines only as told by your health care provider. If you are taking blood pressure or heart medicine, get up slowly and take several minutes to sit and then stand. This can reduce dizziness or light-headedness. Have someone stay with you until you feel stable. If you start to feel like you might faint, lie down right away and raise (elevate) your feet above the level of your heart. Breathe deeply and steadily. Wait until all the symptoms have passed. Keep all follow-up visits as told by your health care provider. This is important. Get help right away if you: Have a severe headache. Faint once or repeatedly. Have pain in your chest, abdomen, or back. Have a very fast  or irregular heartbeat (palpitations). Have pain when you breathe. Are bleeding from your mouth or rectum, or you have black or tarry stool. Have a seizure. Are confused. Have trouble walking. Have severe weakness. Have vision problems. These symptoms may represent a serious problem that is an emergency. Do not wait to see if your symptoms will go away. Get medical help right away. Call your local emergency services (911 in the [place] Do not drive yourself to the hospital. Summary Syncope refers to a condition in which a person temporarily loses consciousness. It is caused by a sudden decrease in blood flow to the brain. Signs that you may be about to faint include dizziness, feeling light-headed, feeling nauseous, sudden vision changes, or cold, clammy skin. Although most causes of syncope are not dangerous, syncope can be a sign of a serious medical problem. If you faint, get medical help right away. This information is not intended to replace advice given to you by your health care provider. Make sure you discuss any questions you have with your healthcare provider. Document Revised: 07/01/2019 Document Reviewed: 07/31/2019 Elsevier Patient Education  Christina Lopez.

## 2020-09-13 NOTE — Progress Notes (Addendum)
Provider:  Larey Seat, MD  Primary Care Physician:  Darreld Mclean, [place] [address] [place] [address]     Referring Provider: Darreld Mclean, Md [address],  [place] [address]          Chief Complaint according to patient   Patient presents with:     New Patient (Initial Visit) RN :      "Fainting spells". This has occurred on and off since she was a child. The last time something happened was in march, she felt sick to her stomach-she recalls that every time she has had a fainting spell she gets nauseous and feels something that moves up her stomach into her neck. Next thing she did remember was that she woke up on the ground. She does lose consciousness with these events.       Neurologic Problem After the spell she has vomiting sometimes, dizziness, becomes lethargic and feels like she needs to sleep, and diaphoretic.  Her husband has witnessed 2 spells and advised there was jerky like motions. Prior to this spell in [place] last one she had was around 2015.          HISTORY OF PRESENT ILLNESS:  Christina Lopez is a 40 y.o. year old White or Caucasian female patient seen here as a referral on 09/13/2020 from PCP  for a Syncope work up- .  Chief concern according to patient :  "Dizziness" I have fainted since my teenage and sometimes  with some twitching , not with convulsions. " I felt worse since my last spell;  New is a headache that followed her last syncope for a few hours, but she did hit her head.    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   Family medical  history: Father has had  syncopes-due to orthostatics, hypotensive BP-  and died of Lymphoma. and PGM has heart disease.     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  The patient currently is not gainfully employed. Tobacco use: none .  ETOH use ; social , 2 glasses of wine on weekends.  Caffeine intake in form of Coffee(  1 am ) Soda( rare ) .Hobbies: goes to the gym, morning cardio exercises.   Her syncopes have not had a common trigger. She believes its more likely when she changes posture rapidly, when she feels nauseated already . During pregnancy she had one syncope, near syncope in the 5 th months. She feels sick, a wave would  come over her, she looses awareness, is unresponsive, but not confused.  No report of tunnel vision, but swimming"     Review of Systems: Out of a complete 14 system review, the patient complains of only the following symptoms, and all other reviewed systems are negative.:    SYNCOPE -  Social History   Socioeconomic History   Marital status: Married    Spouse name: Not on file   Number of children: 0   Years of education: Not on file   Highest  education level: Not on file  Occupational History   Not on file  Tobacco Use   Smoking status: Never   Smokeless tobacco: Never  Substance and Sexual Activity   Alcohol use: No    Alcohol/week: 0.0 standard drinks   Drug use: Never   Sexual activity: Yes  Other Topics Concern   Not on file  Social History Narrative   Not on file   Social Determinants of Health   Financial Resource Strain: Not on file  Food Insecurity: Not on file  Transportation Needs: Not on file  Physical Activity: Not on file  Stress: Not on file  Social Connections: Not on file    Family History  Problem Relation Age of Onset   Cancer Father    Depression Maternal Grandmother    Breast cancer Maternal Grandmother     Heart disease Paternal Grandmother    Hypertension Paternal Grandmother    Diabetes Paternal Grandfather     Past Medical History:  Diagnosis Date   Advanced maternal age, primigravida in third trimester, antepartum 04/24/2018   Anemia    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   Wt Readings from Last 3 Encounters:  09/13/20 135 lb (61.2 kg)  06/03/20 139 lb (63 kg)  11/24/19 137 lb 12.8 oz (62.5 kg)     Ht Readings from Last 3 Encounters:  09/13/20 5\' 2"  (1.575 m)  06/03/20 5\' 2"  (1.575 m)  11/24/19 5\' 2"  (1.575 m)    Orthostatic blood pressures were obtained and in supine position the patient had a blood pressure of 124/80 mmHg, there is a regular heart rate of 74 bpm.  Seated position blood pressure dropped to 97/69 mmHg heart rate remained stable 75 bpm.  Standing heart rate dropped further to 71 bpm but blood pressure recovered to 104/71 still a lot lower than in supine position.  I think this is an orthostatic component.   General: The patient is awake, alert and appears not in acute distress. The patient is well groomed. Head: Normocephalic, atraumatic. Neck is supple. Cardiovascular:  Regular rate and cardiac rhythm by pulse,  without distended neck veins. Respiratory: Lungs are clear to auscultation.  Skin:  Without evidence of ankle edema, or rash. Feet feel cold-  Trunk: The patient's posture is erect.   Neurologic exam : The patient is awake and alert, oriented to place and time.   Memory subjective described as intact.  Attention span & concentration ability appears normal.  Speech is fluent,  without  dysarthria, dysphonia or aphasia.   Mood and affect are appropriate.   Cranial nerves: no loss of smell or taste reported  Pupils are equal and briskly reactive to light. Funduscopic exam deferred.  Extraocular movements in vertical and horizontal planes were intact and without nystagmus. No Diplopia. RAPID HEAD MOVEMENT causes Dizziness. Rapid posture changed cause DIZZINESS>   Visual fields by finger perimetry are intact. Hearing was intact to soft voice and finger rubbing.  Facial sensation intact to fine touch.Facial motor strength is symmetric and tongue and uvula move midline.  Neck ROM : rotation, tilt and flexion extension were normal for age  and shoulder shrug was symmetrical.    Motor exam:  Symmetric bulk, tone and ROM.   Normal tone without cog wheeling, symmetric grip strength .   Sensory:  Fine touch normal.  Proprioception tested in the upper extremities was normal.   Coordination: Rapid alternating movements in the fingers/hands were of normal speed.  The Finger-to-nose maneuver was intact without evidence of ataxia, dysmetria or tremor.   Gait and station: Patient could rise unassisted from a seated position, walked without assistive device.  Stance is of normal width/ base and the patient turned with 3 steps.  Toe and heel walk were deferred.  Deep tendon reflexes: in the  upper and lower extremities are symmetric and brisk- at the patella- intact.  Babinski response was deferred.      After spending a total time of 39  minutes face to face and additional time for physical and neurologic examination, review of laboratory studies,  personal review of imaging studies, reports and results of other testing and review of referral information / records as far as provided in visit, I have established the following assessments:  1) there are 2 components, one is a drop in blood pressure that was quite significant changing from a supine position to a seated position and the lack of heart rate uptake in response  to the drop in blood pressure.    #2 the patient does easily feel dizzy when she moves her head rapidly.   She does not report vertigo she is not in the position where she feels that the room is spinning around her or that she is not spinning motion.   My Plan is to proceed with:  1) keep a hydration log.  2) electrolyte intake, make sure there is enough magnesium, potassium.  3) cardiac exercises recommended, running, biking. 4) consider compression stockings in winter, (but in summer I would refrain).    No EEG needed. These spells have been resent for almost 30 years, infrequent, same pattern.  MRI discussed , but her headaches have resolved now.  She had migraines in high school age, with vomiting and photophobia. Her migraines stopped at age [age], after she discontinued BC pill.  She is sleeping well.   No VERTIGO>   I would like to thank Copland, Gay Filler, MD [address],  [place] [id number] for allowing me to meet with and to take care of this pleasant patient.  In short, Christina Lopez is presenting with orthostatic hypotension and incomplete heart rate recovery . I plan to follow up either personally or through our NP prn.   CC: I will share my notes with PCP.  Electronically signed by: Larey Seat, MD 09/13/2020 2:32 PM  [facility] Board certified by The [employer] of Sleep Medicine and Diplomate of the [employer] of Sleep Medicine. Board certified In Neurology through the [place], Fellow of the [employer] of Neurology. Medical Director of [employer].

## 2020-09-27 ENCOUNTER — Other Ambulatory Visit: Payer: Self-pay

## 2020-09-27 ENCOUNTER — Ambulatory Visit (INDEPENDENT_AMBULATORY_CARE_PROVIDER_SITE_OTHER): Payer: BC Managed Care – PPO | Admitting: Cardiology

## 2020-09-27 ENCOUNTER — Encounter: Payer: Self-pay | Admitting: Cardiology

## 2020-09-27 ENCOUNTER — Ambulatory Visit (INDEPENDENT_AMBULATORY_CARE_PROVIDER_SITE_OTHER): Payer: BC Managed Care – PPO

## 2020-09-27 VITALS — BP 126/86 | HR 79 | Ht 62.0 in | Wt 135.0 lb

## 2020-09-27 DIAGNOSIS — E782 Mixed hyperlipidemia: Secondary | ICD-10-CM

## 2020-09-27 DIAGNOSIS — I951 Orthostatic hypotension: Secondary | ICD-10-CM

## 2020-09-27 DIAGNOSIS — R55 Syncope and collapse: Secondary | ICD-10-CM

## 2020-09-27 DIAGNOSIS — R011 Cardiac murmur, unspecified: Secondary | ICD-10-CM

## 2020-09-27 HISTORY — DX: Cardiac murmur, unspecified: R01.1

## 2020-09-27 HISTORY — DX: Mixed hyperlipidemia: E78.2

## 2020-09-27 NOTE — Patient Instructions (Addendum)
Medication Instructions:  No medication changes. *If you need a refill on your cardiac medications before your next appointment, please call your pharmacy*   Lab Work: None ordered If you have labs (blood work) drawn today and your tests are completely normal, you will receive your results only by: [place] (if you have MyChart) OR A paper copy in the mail If you have any lab test that is abnormal or we need to change your treatment, we will call you to review the results.   Testing/Procedures: Your physician has requested that you have an echocardiogram. Echocardiography is a painless test that uses sound waves to create images of your heart. It provides your doctor with information about the size and shape of your heart and how well your heart's chambers and valves are working. This procedure takes approximately one hour. There are no restrictions for this procedure.   WHY IS MY DOCTOR PRESCRIBING ZIO? The Zio system is proven and trusted by physicians to detect and diagnose irregular heart rhythms -- and has been prescribed to hundreds of thousands of patients.  The FDA has cleared the Zio system to monitor for many different kinds of irregular heart rhythms. In a study, physicians were able to reach a diagnosis 90% of the time with the Zio system1.  You can wear the Zio monitor -- a small, discreet, comfortable patch -- during your normal day-to-day activity, including while you sleep, shower, and exercise, while it records every single heartbeat for analysis.  1Barrett, P., et al. Comparison of 24 Hour Holter Monitoring Versus 14 Day Novel Adhesive Patch Electrocardiographic Monitoring. [place], 2014.  ZIO VS. HOLTER MONITORING The Zio monitor can be comfortably worn for up to 14 days. Holter monitors can be worn for 24 to 48 hours, limiting the time to record any irregular heart rhythms you may have. Zio is able to capture data for the 51% of patients who  have their first symptom-triggered arrhythmia after 48 hours.1  LIVE WITHOUT RESTRICTIONS The Zio ambulatory cardiac monitor is a small, unobtrusive, and water-resistant patch--you might even forget you're wearing it. The Zio monitor records and stores every beat of your heart, whether you're sleeping, working out, or showering. Wear the monitor for 2 weeks, remove 10/11/20.   Follow-Up: At [facility], you and your health needs are our priority.  As part of our continuing mission to provide you with exceptional heart care, we have created designated Provider Care Teams.  These Care Teams include your primary Cardiologist (physician) and Advanced Practice Providers (APPs -  Physician Assistants and Nurse Practitioners) who all work together to provide you with the care you need, when you need it.  We recommend signing up for the patient portal called "MyChart".  Sign up information is provided on this After Visit Summary.  MyChart is used to connect with patients for Virtual Visits (Telemedicine).  Patients are able to view lab/test results, encounter notes, upcoming appointments, etc.  Non-urgent messages can be sent to your provider as well.   To learn more about what you can do with MyChart, go to [email or web address].    Your next appointment:   3 month(s)  The format for your next appointment:   In Person  Provider:   Jyl Heinz, MD   Other Instructions Echocardiogram An echocardiogram is a test that uses sound waves (ultrasound) to produce images of the heart. Images from an echocardiogram can provide important information about: Heart size and shape. The size and thickness and  movement of your heart's walls. Heart muscle function and strength. Heart valve function or if you have stenosis. Stenosis is when the heart valves are too narrow. If blood is flowing backward through the heart valves (regurgitation). A tumor or infectious growth around the heart valves. Areas  of heart muscle that are not working well because of poor blood flow or injury from a heart attack. Aneurysm detection. An aneurysm is a weak or damaged part of an artery wall. The wall bulges out from the normal force of blood pumping through the body. Tell a health care provider about: Any allergies you have. All medicines you are taking, including vitamins, herbs, eye drops, creams, and over-the-counter medicines. Any blood disorders you have. Any surgeries you have had. Any medical conditions you have. Whether you are pregnant or may be pregnant. What are the risks? Generally, this is a safe test. However, problems may occur, including an allergic reaction to dye (contrast) that may be used during the test. What happens before the test? No specific preparation is needed. You may eat and drink normally. What happens during the test? You will take off your clothes from the waist up and put on a hospital gown. Electrodes or electrocardiogram (ECG)patches may be placed on your chest. The electrodes or patches are then connected to a device that monitors your heart rate and rhythm. You will lie down on a table for an ultrasound exam. A gel will be applied to your chest to help sound waves pass through your skin. A handheld device, called a transducer, will be pressed against your chest and moved over your heart. The transducer produces sound waves that travel to your heart and bounce back (or "echo" back) to the transducer. These sound waves will be captured in real-time and changed into images of your heart that can be viewed on a video monitor. The images will be recorded on a computer and reviewed by your health care provider. You may be asked to change positions or hold your breath for a short time. This makes it easier to get different views or better views of your heart. In some cases, you may receive contrast through an IV in one of your veins. This can improve the quality of the pictures  from your heart. The procedure may vary among health care providers and hospitals.   What can I expect after the test? You may return to your normal, everyday life, including diet, activities, and medicines, unless your health care provider tells you not to do that. Follow these instructions at home: It is up to you to get the results of your test. Ask your health care provider, or the department that is doing the test, when your results will be ready. Keep all follow-up visits. This is important. Summary An echocardiogram is a test that uses sound waves (ultrasound) to produce images of the heart. Images from an echocardiogram can provide important information about the size and shape of your heart, heart muscle function, heart valve function, and other possible heart problems. You do not need to do anything to prepare before this test. You may eat and drink normally. After the echocardiogram is completed, you may return to your normal, everyday life, unless your health care provider tells you not to do that. This information is not intended to replace advice given to you by your health care provider. Make sure you discuss any questions you have with your health care provider. Document Revised: 11/11/2019 Document Reviewed: 11/11/2019 Elsevier Patient  Education  2021 [place] personal EKG: The world's most clinically validated personal EKG, FDA-cleared to detect Atrial Fibrillation, Bradycardia, and Tachycardia. Evalee Mutton is the most reliable way to check in on your heart from home. Take your EKG from anywhere: Capture a medical-grade EKG in 30 seconds and get an instant analysis right on your smartphone. Evalee Mutton is small enough to fit in your pocket, so you can take it with you anywhere. Easy to use: Simply place your fingers on the sensors--no wires, patches, or gels. Recommended by doctors: A trusted resource, Evalee Mutton is the #1 doctor-recommended personal EKG  with more than 100 million EKGs recorded. Save or share your EKGs: With the press of a button, email your EKGs to your doctor or save them on your phone. Works with smartphones: Compatible with Tour manager and tablets. Check our compatibility chart. FSA/HSA eligible: Purchase using an FSA or HSA account (please confirm coverage with your insurance provider). Phone clip included with purchase, a $15 value. Conveniently take your device with you wherever you go.  [email or web address]://[email or web address].tn  [facility] also carries this in the store. You do not need to purchase the monthly plan.

## 2020-09-27 NOTE — Progress Notes (Signed)
Cardiology Office Note:    Date:  09/27/2020   ID:  Christina Lopez, DOB 1980-07-16, MRN [id number]  PCP:  Darreld Mclean, MD  Cardiologist:  Jenean Lindau, MD   Referring MD: Darreld Mclean, MD    ASSESSMENT:    1. Orthostatic hypotension   2. Syncope, unspecified syncope type   3. Cardiac murmur   4. Mixed dyslipidemia    PLAN:    In order of problems listed above:  Primary prevention stressed with the patient.  Importance of compliance with diet medication stressed any vocalized understanding.  She exercises very regularly. Syncope: I reviewed records.  I am not clear as to what is the cause of etiology.  She is a fit lady otherwise.  I will do a 2-week monitoring to assess her symptoms. Cardiac murmur: Echocardiogram will be done to assess murmur heard on auscultation. Mixed dyslipidemia: Diet was emphasized and she promises to do better.  Risks explained.   She knows to go to the nearest emergency room for any significant symptoms.  In view of syncope she was told to refrain from driving till see is cleared by primary doctor. Patient will be seen in follow-up appointment in 6 months or earlier if the patient has any concerns   Medication Adjustments/Labs and Tests Ordered: Current medicines are reviewed at length with the patient today.  Concerns regarding medicines are outlined above.  No orders of the defined types were placed in this encounter.  No orders of the defined types were placed in this encounter.    History of Present Illness:    Christina Lopez is a 40 y.o. female who is being seen today for the evaluation of syncope at the request of Copland, Gay Filler, MD. patient is a pleasant 40 year old female.  She has no significant past medical history from a cardiovascular standpoint.  She gives history of syncope.  She is not clear about the details.  The last time she had a syncope was in the month of March.  No chest pain orthopnea or PND.  She  is an active lady and exercises on a regular basis.  At the time of my evaluation, the patient is alert awake oriented and in no distress.  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     Allergies:   Patient has no known allergies.   Social History   Socioeconomic History   Marital status: Married    Spouse name: Not on file   Number of children: 0   Years of education: Not on file   Highest education level: Not on file  Occupational History   Not on file  Tobacco Use   Smoking status: Never   Smokeless tobacco: Never  Substance and Sexual Activity   Alcohol use: No    Alcohol/week: 0.0 standard drinks   Drug use: Never   Sexual activity: Yes  Other Topics Concern   Not on file  Social  History Narrative   Not on file   Social Determinants of Health   Financial Resource Strain: Not on file  Food Insecurity: Not on file  Transportation Needs: Not on file  Physical Activity: Not on file  Stress: Not on file  Social Connections: Not on file     Family History: The patient's family history includes Breast cancer in her maternal grandmother; Cancer in her father; Depression in her maternal grandmother; Diabetes in her paternal grandfather; Heart disease in her paternal grandmother; Hypertension in her paternal grandmother.  ROS:   Please see the history of present illness.    All other systems reviewed and are negative.  EKGs/Labs/Other  Studies Reviewed:    The following studies were reviewed today: EKG reveals sinus rhythm and nonspecific ST-T changes   Recent Labs: 06/03/2020: ALT 12; BUN 15; Creatinine, Ser 0.69; Hemoglobin 12.2; Platelets 240.0; Potassium 4.1; Sodium 138; TSH 1.16  Recent Lipid Panel    Component Value Date/Time   CHOL 240 (H) 11/24/2019 1410   TRIG 79 11/24/2019 1410   HDL 64 11/24/2019 1410   CHOLHDL 3.8 11/24/2019 1410   LDLCALC 158 (H) 11/24/2019 1410    Physical Exam:    VS:  BP 126/86   Pulse 79   Ht 5\' 2"  (1.575 m)   Wt 135 lb (61.2 kg)   SpO2 99%   BMI 24.69 kg/m     Wt Readings from Last 3 Encounters:  09/27/20 135 lb (61.2 kg)  09/13/20 135 lb (61.2 kg)  06/03/20 139 lb (63 kg)     GEN: Patient is in no acute distress HEENT: Normal NECK: No JVD; No carotid bruits LYMPHATICS: No lymphadenopathy CARDIAC: S1 S2 regular, 2/6 systolic murmur at the apex. RESPIRATORY:  Clear to auscultation without rales, wheezing or rhonchi  ABDOMEN: Soft, non-tender, non-distended MUSCULOSKELETAL:  No edema; No deformity  SKIN: Warm and dry NEUROLOGIC:  Alert and oriented x 3 PSYCHIATRIC:  Normal affect    Signed, Jenean Lindau, MD  09/27/2020 11:07 AM    [place]

## 2020-10-13 ENCOUNTER — Other Ambulatory Visit: Payer: Self-pay

## 2020-10-13 ENCOUNTER — Ambulatory Visit (HOSPITAL_BASED_OUTPATIENT_CLINIC_OR_DEPARTMENT_OTHER)
Admission: RE | Admit: 2020-10-13 | Discharge: 2020-10-13 | Disposition: A | Discharge status: Home / Self Care | Payer: BC Managed Care – PPO | Source: Ambulatory Visit | Attending: Cardiology | Admitting: Cardiology

## 2020-10-13 DIAGNOSIS — R55 Syncope and collapse: Secondary | ICD-10-CM | POA: Diagnosis present

## 2020-10-13 DIAGNOSIS — R011 Cardiac murmur, unspecified: Secondary | ICD-10-CM | POA: Insufficient documentation

## 2020-10-13 LAB — ECHOCARDIOGRAM COMPLETE
AR max vel: 1.88 cm2
AV Area VTI: 1.74 cm2
AV Area mean vel: 1.92 cm2
AV Mean grad: 4 mmHg
AV Peak grad: 7.6 mmHg
Ao pk vel: 1.38 m/s
Area-P 1/2: 4.49 cm2
Calc EF: 51.8 %
S' Lateral: 3.61 cm
Single Plane A2C EF: 54.5 %
Single Plane A4C EF: 50.1 %

## 2020-10-13 NOTE — Progress Notes (Signed)
*  PRELIMINARY RESULTS* Echocardiogram 2D Echocardiogram has been performed.  Luisa Hart RDCS 10/13/2020, 10:50 AM

## 2020-10-19 ENCOUNTER — Telehealth: Payer: Self-pay | Admitting: Cardiology

## 2020-10-19 NOTE — Telephone Encounter (Signed)
    Pt is returning call to get echo results, pt requested to call he back tomorrow

## 2020-10-19 NOTE — Telephone Encounter (Signed)
Will callback tomorrow as requested.

## 2020-10-21 ENCOUNTER — Telehealth: Payer: Self-pay | Admitting: Cardiology

## 2020-10-21 ENCOUNTER — Telehealth: Payer: Self-pay

## 2020-10-21 NOTE — Telephone Encounter (Signed)
Tried calling patient. No answer and no voicemail set up for me to leave a message. 

## 2020-10-21 NOTE — Telephone Encounter (Signed)
-----   Message from Jenean Lindau, MD sent at 10/20/2020  9:06 AM EDT ----- The results of the study is unremarkable. Please inform patient. I will discuss in detail at next appointment. Cc  primary care/referring physician Jenean Lindau, MD 10/20/2020 9:06 AM

## 2020-10-21 NOTE — Telephone Encounter (Signed)
Spoke with patient regarding results and recommendation.  Patient verbalizes understanding and is agreeable to plan of care. Advised patient to call back with any issues or concerns.  

## 2020-10-21 NOTE — Telephone Encounter (Signed)
Follow up:    Patient returning a call back for results.

## 2020-12-16 ENCOUNTER — Ambulatory Visit: Payer: BC Managed Care – PPO | Admitting: Cardiology

## 2021-01-26 ENCOUNTER — Ambulatory Visit
Admission: RE | Admit: 2021-01-26 | Discharge: 2021-01-26 | Disposition: A | Discharge status: Home / Self Care | Payer: BC Managed Care – PPO | Source: Ambulatory Visit | Attending: Family Medicine | Admitting: Family Medicine

## 2021-01-26 ENCOUNTER — Other Ambulatory Visit: Payer: Self-pay

## 2021-01-26 DIAGNOSIS — R928 Other abnormal and inconclusive findings on diagnostic imaging of breast: Secondary | ICD-10-CM

## 2021-02-03 ENCOUNTER — Ambulatory Visit: Payer: BC Managed Care – PPO | Admitting: Cardiology

## 2022-01-26 ENCOUNTER — Telehealth: Payer: Self-pay | Admitting: Family Medicine

## 2022-01-26 NOTE — Telephone Encounter (Signed)
[name] is calling to request breast imagine reports that the patient might have gotten done here. She states that the patient has an appt tomorrow and they need it asap. It can be faxed to [phone] . Please advise.

## 2022-01-30 NOTE — Telephone Encounter (Signed)
Recs faxed

## 2023-08-05 IMAGING — MG DIGITAL DIAGNOSTIC BILAT W/ TOMO W/ CAD
6 of 10 series · 6 of 30 positions shown · non-contrast
Comparison: Previous exam(s).

CLINICAL DATA: 40-year-old female presenting for annual bilateral
mass.

EXAM:
DIGITAL DIAGNOSTIC BILATERAL MAMMOGRAM WITH TOMOSYNTHESIS AND CAD;
ULTRASOUND RIGHT BREAST LIMITED
TECHNIQUE: Bilateral digital diagnostic mammography and breast tomosynthesis
was performed. The images were evaluated with computer-aided
detection.; Targeted ultrasound examination of the right breast was
performed

[R MLO synth-2D (1 of 2)]
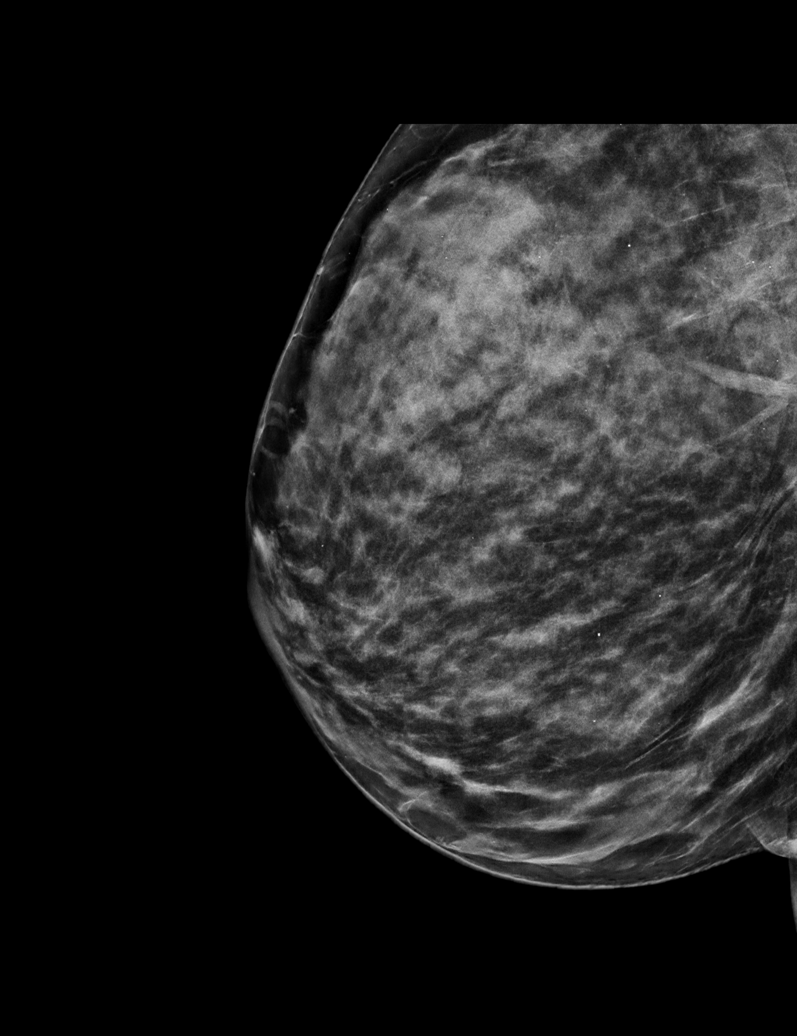

[R MLO synth-2D (2 of 2)]
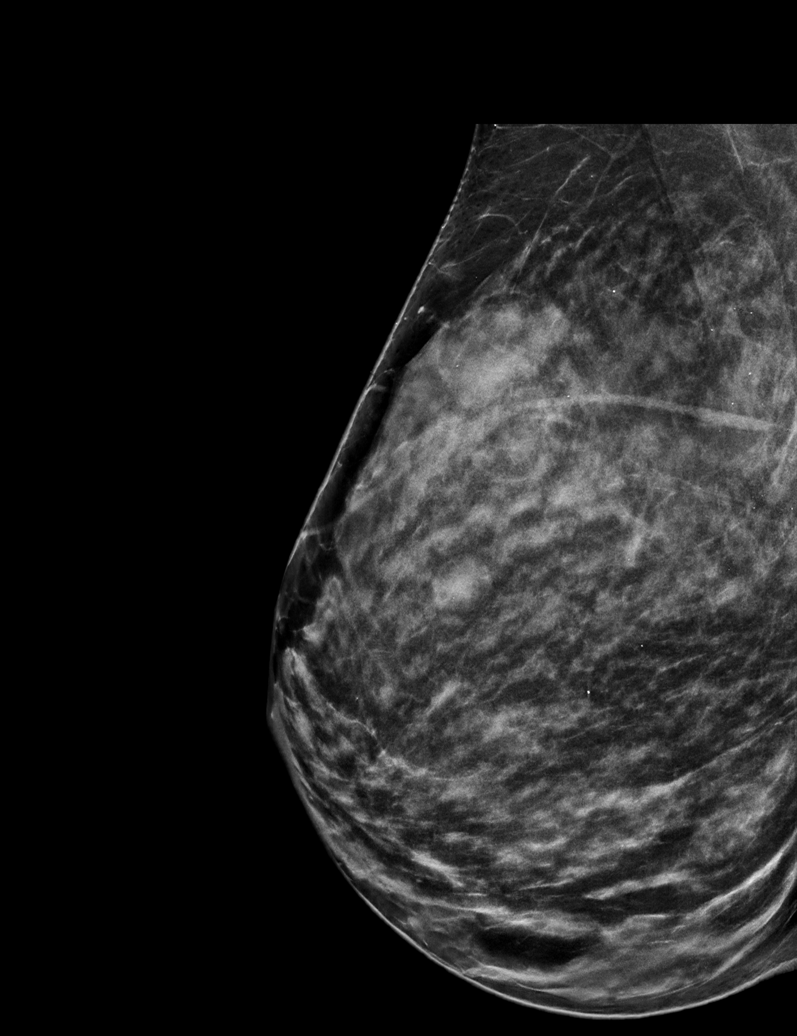

[L CC synth-2D]
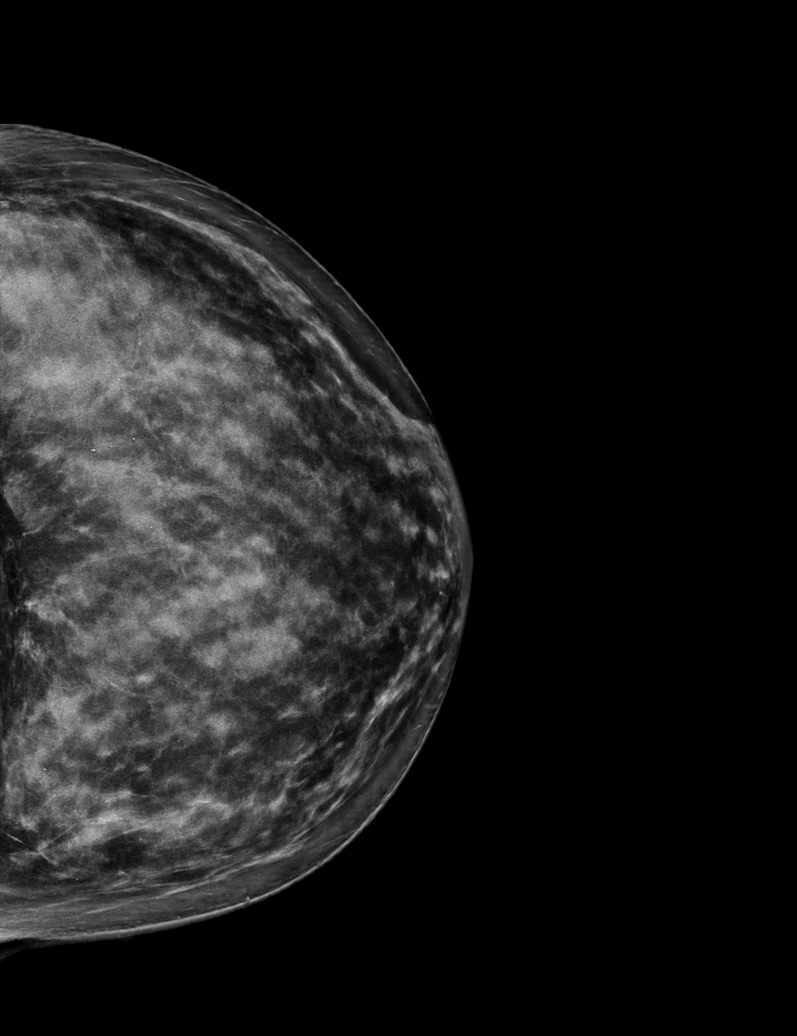

[R CC synth-2D]
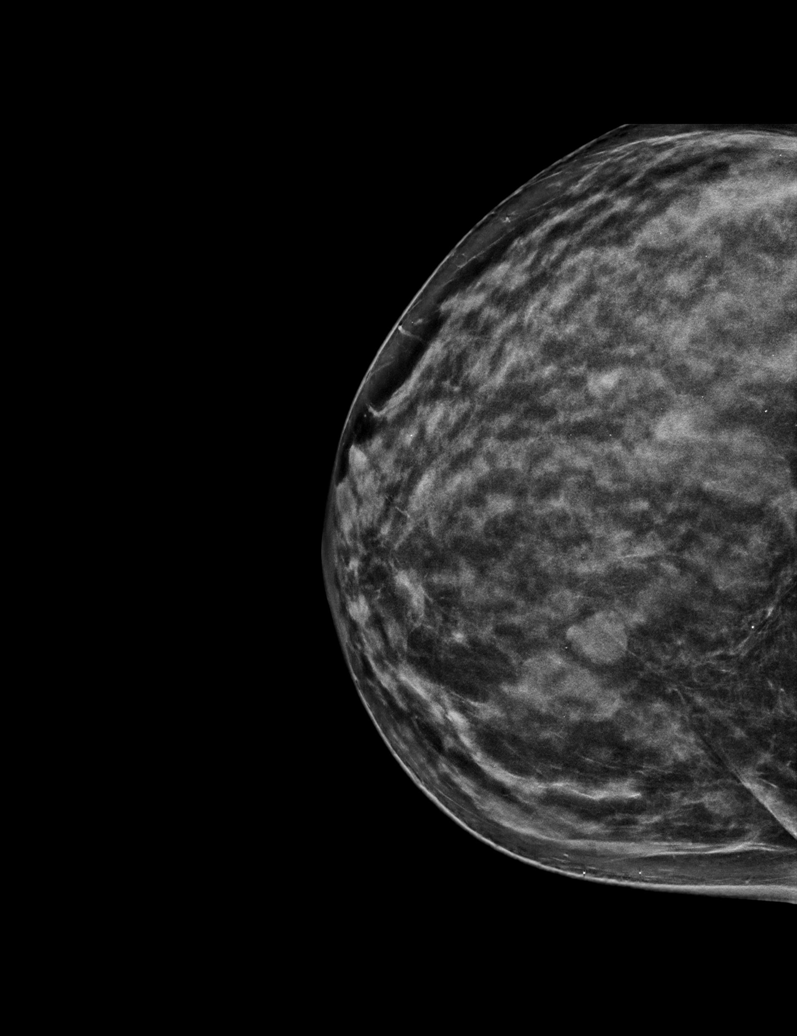

[L MLO synth-2D]
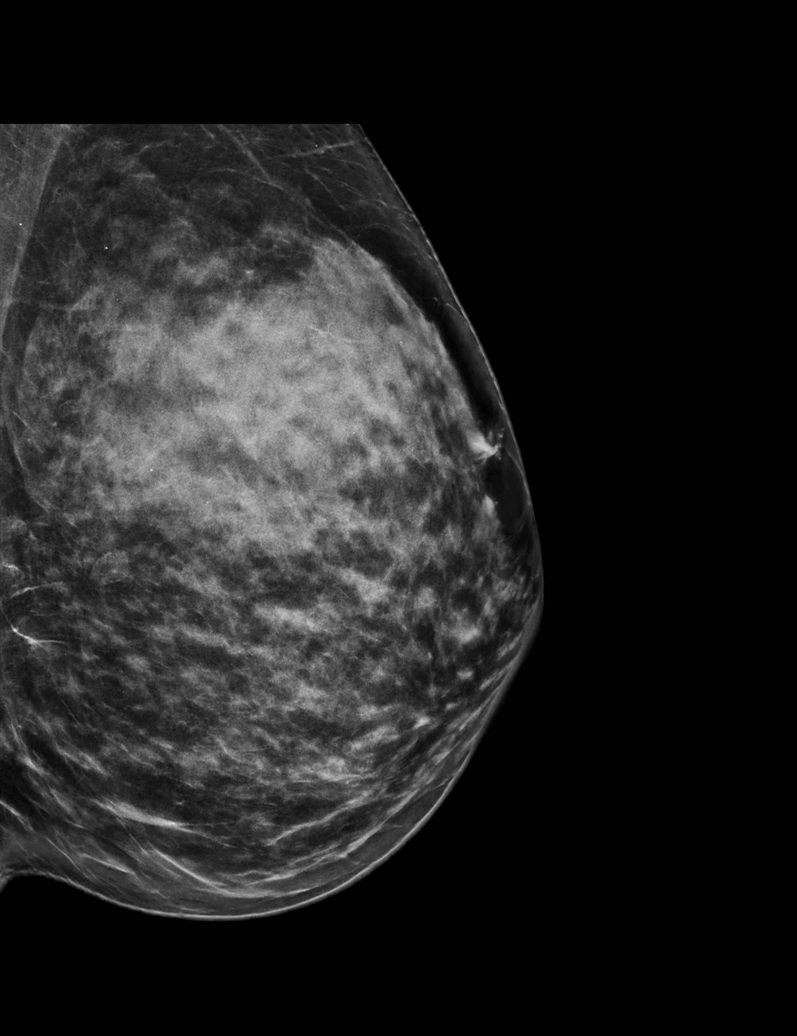

[R CC tomo · tomo slice 32/63.0]
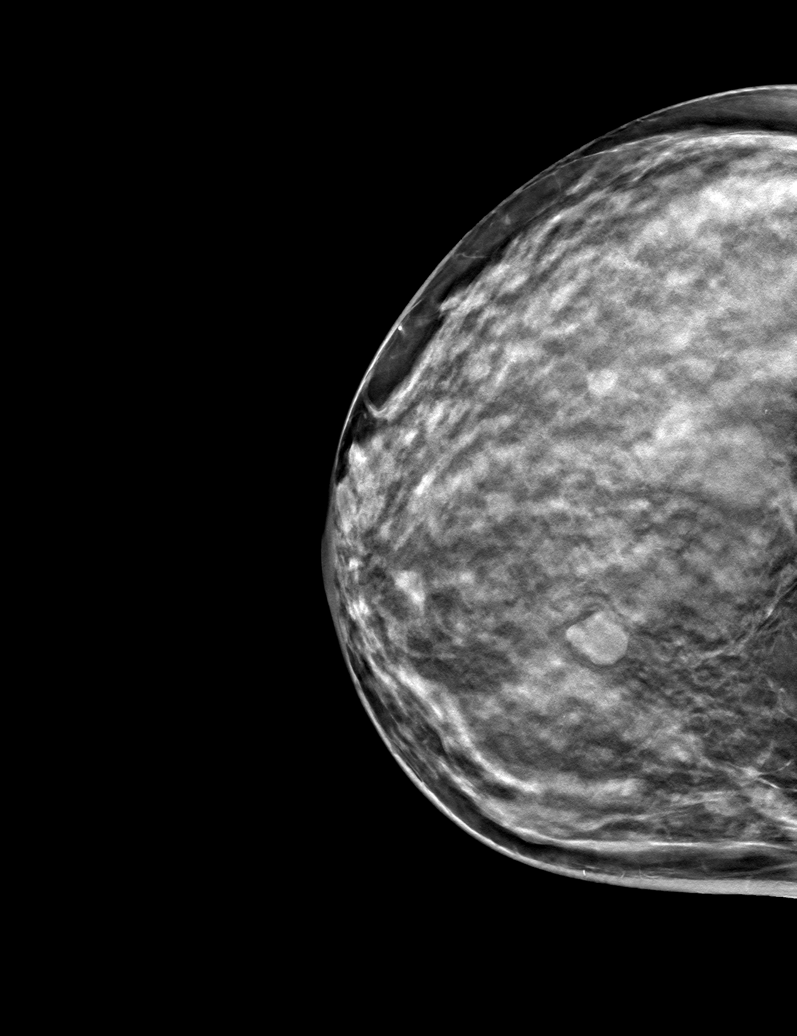

[6 of 30 positions shown; findings below may reference images not displayed]

ACR Breast Density Category d: The breast tissue is extremely dense,
which lowers the sensitivity of mammography.
FINDINGS: An oval, circumscribed equal density mass in the upper inner right
breast is mammographically stable. No new or suspicious findings are
identified in either breast. The parenchymal pattern is stable.

Targeted ultrasound is performed, showing stable appearance of an
oval, circumscribed hypoechoic mass at the 1 o'clock position 3 cm
from the nipple. It measures 1.3 x 0.8 x 1.2 cm (previously 1.2 x
0.7 x 1.2 cm).
IMPRESSION: 1. Stable, probably benign right breast mass. Recommend a final
ultrasound follow-up in 1 year.
2. No mammographic evidence of malignancy on the left.

RECOMMENDATION:
Bilateral diagnostic mammogram and right breast ultrasound in 1
year.

I have discussed the findings and recommendations with the patient.
If applicable, a reminder letter will be sent to the patient
regarding the next appointment.

BI-RADS CATEGORY  3: Probably benign.
